# Patient Record
Sex: Female | Born: 1970 | Race: Black or African American | Hispanic: No | Smoking: Never smoker
Health system: Southern US, Community
[De-identification: ages and names within clinical notes are randomized; demographics above are authoritative.]

## PROBLEM LIST (undated history)

## (undated) DIAGNOSIS — C8590 Non-Hodgkin lymphoma, unspecified, unspecified site: Secondary | ICD-10-CM

## (undated) DIAGNOSIS — R011 Cardiac murmur, unspecified: Secondary | ICD-10-CM

## (undated) HISTORY — PX: OTHER SURGICAL HISTORY: SHX169

## (undated) HISTORY — PX: PORTACATH PLACEMENT: SHX2246

## (undated) HISTORY — PX: TUBAL LIGATION: SHX77

---

## 2012-08-31 ENCOUNTER — Emergency Department (HOSPITAL_COMMUNITY)
Admission: EM | Admit: 2012-08-31 | Discharge: 2012-08-31 | Disposition: A | Discharge status: Home / Self Care | Payer: Medicaid - Out of State | Attending: Emergency Medicine | Admitting: Emergency Medicine

## 2012-08-31 ENCOUNTER — Encounter (HOSPITAL_COMMUNITY): Payer: Self-pay | Admitting: *Deleted

## 2012-08-31 ENCOUNTER — Emergency Department (HOSPITAL_COMMUNITY): Payer: Medicaid - Out of State

## 2012-08-31 DIAGNOSIS — X58XXXA Exposure to other specified factors, initial encounter: Secondary | ICD-10-CM | POA: Insufficient documentation

## 2012-08-31 DIAGNOSIS — Y9389 Activity, other specified: Secondary | ICD-10-CM | POA: Insufficient documentation

## 2012-08-31 DIAGNOSIS — Z79899 Other long term (current) drug therapy: Secondary | ICD-10-CM | POA: Insufficient documentation

## 2012-08-31 DIAGNOSIS — S93402A Sprain of unspecified ligament of left ankle, initial encounter: Secondary | ICD-10-CM

## 2012-08-31 DIAGNOSIS — R011 Cardiac murmur, unspecified: Secondary | ICD-10-CM | POA: Insufficient documentation

## 2012-08-31 DIAGNOSIS — S93409A Sprain of unspecified ligament of unspecified ankle, initial encounter: Secondary | ICD-10-CM | POA: Insufficient documentation

## 2012-08-31 DIAGNOSIS — Y929 Unspecified place or not applicable: Secondary | ICD-10-CM | POA: Insufficient documentation

## 2012-08-31 DIAGNOSIS — E119 Type 2 diabetes mellitus without complications: Secondary | ICD-10-CM | POA: Insufficient documentation

## 2012-08-31 HISTORY — DX: Cardiac murmur, unspecified: R01.1

## 2012-08-31 MED ORDER — MELOXICAM 7.5 MG PO TABS: ORAL_TABLET | ORAL | Status: DC

## 2012-08-31 MED ORDER — HYDROCODONE-ACETAMINOPHEN 5-325 MG PO TABS: ORAL_TABLET | ORAL | Status: DC

## 2012-08-31 NOTE — ED Provider Notes (Signed)
Medical screening examination/treatment/procedure(s) were performed by non-physician practitioner and as supervising physician I was immediately available for consultation/collaboration.   Laray Anger, DO 08/31/12 Margretta Ditty

## 2012-08-31 NOTE — ED Notes (Signed)
Lt foot pain for 3 days, no injury,  Alert, Nad.

## 2012-08-31 NOTE — ED Notes (Signed)
H. Bryant, PA at bedside. 

## 2012-08-31 NOTE — ED Provider Notes (Signed)
History     CSN: [id number]  Arrival date & time 08/31/12  1112   First MD Initiated Contact with Patient 08/31/12 1131      Chief Complaint  Patient presents with  . Foot Pain    (Consider location/radiation/quality/duration/timing/severity/associated sxs/prior treatment) Patient is a 42 y.o. female presenting with lower extremity pain. The history is provided by the patient.  Foot Pain This is a recurrent problem. The current episode started in the past 7 days. The problem occurs constantly. The problem has been gradually worsening. Associated symptoms include joint swelling. Pertinent negatives include no abdominal pain, arthralgias, chest pain, coughing, neck pain or numbness. The symptoms are aggravated by standing and walking. She has tried NSAIDs for the symptoms. The treatment provided no relief.    Past Medical History  Diagnosis Date  . Murmur   . Diabetes mellitus without complication     Past Surgical History  Procedure Laterality Date  . Tubal ligation      History reviewed. No pertinent family history.  History  Substance Use Topics  . Smoking status: Never Smoker   . Smokeless tobacco: Not on file  . Alcohol Use: No    OB History   Grav Para Term Preterm Abortions TAB SAB Ect Mult Living                  Review of Systems  Constitutional: Negative for activity change.       All ROS Neg except as noted in HPI  HENT: Negative for nosebleeds and neck pain.   Eyes: Negative for photophobia and discharge.  Respiratory: Negative for cough, shortness of breath and wheezing.   Cardiovascular: Negative for chest pain and palpitations.  Gastrointestinal: Negative for abdominal pain and blood in stool.  Genitourinary: Negative for dysuria, frequency and hematuria.  Musculoskeletal: Positive for joint swelling. Negative for back pain and arthralgias.  Skin: Negative.   Neurological: Negative for dizziness, seizures, speech difficulty and numbness.   Psychiatric/Behavioral: Negative for hallucinations and confusion.    Allergies  Penicillins  Home Medications   Current Outpatient Rx  Name  Route  Sig  Dispense  Refill  . ferrous sulfate 325 (65 FE) MG tablet   Oral   Take 325 mg by mouth daily with breakfast.         . hydrochlorothiazide (HYDRODIURIL) 25 MG tablet   Oral   Take 25 mg by mouth daily.         Marland Kitchen lisinopril (PRINIVIL,ZESTRIL) 20 MG tablet   Oral   Take 20 mg by mouth daily.         . metFORMIN (GLUCOPHAGE) 500 MG tablet   Oral   Take 500 mg by mouth 2 (two) times daily with a meal.         . HYDROcodone-acetaminophen (NORCO/VICODIN) 5-325 MG per tablet      1 or 2 po q4h prn pain   20 tablet   0   . meloxicam (MOBIC) 7.5 MG tablet      1 po bid with food   12 tablet   0     BP 138/82  Pulse 93  Temp(Src) 98.1 F (36.7 C) (Oral)  Resp 20  Ht 5\' 3"  (1.6 m)  Wt 205 lb (92.987 kg)  BMI 36.32 kg/m2  SpO2 100%  Physical Exam  Nursing note and vitals reviewed. Constitutional: She is oriented to person, place, and time. She appears well-developed and well-nourished.  Non-toxic appearance.  HENT:  Head: Normocephalic.  Right Ear: Tympanic membrane and external ear normal.  Left Ear: Tympanic membrane and external ear normal.  Eyes: EOM and lids are normal. Pupils are equal, round, and reactive to light.  Neck: Normal range of motion. Neck supple. Carotid bruit is not present.  Cardiovascular: Normal rate, regular rhythm, normal heart sounds, intact distal pulses and normal pulses.   Pulmonary/Chest: Breath sounds normal. No respiratory distress.  Abdominal: Soft. Bowel sounds are normal. There is no tenderness. There is no guarding.  Musculoskeletal: Normal range of motion.  There is pain of the left lateral malleolus. There is mild swelling present. Full range of motion of the toes. The Achilles tendon is intact. Dorsalis pedis pulses 2+. Full range of motion of the left knee and left  hip  Lymphadenopathy:       Head (right side): No submandibular adenopathy present.       Head (left side): No submandibular adenopathy present.    She has no cervical adenopathy.  Neurological: She is alert and oriented to person, place, and time. She has normal strength. No cranial nerve deficit or sensory deficit.  Skin: Skin is warm and dry.  Psychiatric: She has a normal mood and affect. Her speech is normal.    ED Course  Procedures (including critical care time)  Labs Reviewed - No data to display Dg Foot Complete Left  08/31/2012  *RADIOLOGY REPORT*  Clinical Data: Lateral foot/heel pain  LEFT FOOT - COMPLETE 3+ VIEW  Comparison: None.  Findings: No fracture or dislocation is seen.  The joint spaces are preserved.  Small plantar and posterior calcaneal enthesophytes.  Visualized soft tissues are grossly unremarkable.  IMPRESSION: No acute osseous abnormality is seen.   Original Report Authenticated By: Charline Bills, M.D.      1. Ankle sprain, left, initial encounter       MDM  I have reviewed nursing notes, vital signs, and all appropriate lab and imaging results for this patient. Patient states she does a lot of standing and walking at her job. She is not sure of turning or twisting her ankle and in the one specific time, but states that from time to time she makes" missed steps". The x-ray of the left ankle is negative for fracture or dislocation. There is noted a calcaneal spur present.  The plan at this time is for the patient be treated with very short course of Mobic 7.5 mg, prescription for Norco one or 2 every 4 hours #20 tablets also given to the patient. Patient was fitted with an ankle stirrup splint she will use for the next 10 days. Patient is to return if any changes or complications.      Kathie Dike, [place] 08/31/12 1242

## 2012-08-31 NOTE — ED Notes (Signed)
Patient transported to X-ray 

## 2013-09-24 ENCOUNTER — Encounter (HOSPITAL_COMMUNITY): Payer: Self-pay | Admitting: Emergency Medicine

## 2013-09-24 ENCOUNTER — Emergency Department (HOSPITAL_COMMUNITY)
Admission: EM | Admit: 2013-09-24 | Discharge: 2013-09-24 | Disposition: A | Discharge status: Home / Self Care | Payer: Medicaid - Out of State | Attending: Emergency Medicine | Admitting: Emergency Medicine

## 2013-09-24 DIAGNOSIS — M62838 Other muscle spasm: Secondary | ICD-10-CM | POA: Insufficient documentation

## 2013-09-24 DIAGNOSIS — Z880 Allergy status to penicillin: Secondary | ICD-10-CM | POA: Insufficient documentation

## 2013-09-24 DIAGNOSIS — M5412 Radiculopathy, cervical region: Secondary | ICD-10-CM | POA: Insufficient documentation

## 2013-09-24 DIAGNOSIS — R011 Cardiac murmur, unspecified: Secondary | ICD-10-CM | POA: Insufficient documentation

## 2013-09-24 DIAGNOSIS — E119 Type 2 diabetes mellitus without complications: Secondary | ICD-10-CM | POA: Insufficient documentation

## 2013-09-24 DIAGNOSIS — Z79899 Other long term (current) drug therapy: Secondary | ICD-10-CM | POA: Insufficient documentation

## 2013-09-24 MED ORDER — CYCLOBENZAPRINE HCL 5 MG PO TABS: 5.0000 mg | ORAL_TABLET | Freq: Three times a day (TID) | ORAL | Status: DC | PRN

## 2013-09-24 MED ORDER — TRAMADOL HCL 50 MG PO TABS: 50.0000 mg | ORAL_TABLET | Freq: Four times a day (QID) | ORAL | Status: DC | PRN

## 2013-09-24 NOTE — Discharge Instructions (Signed)
Cervical Radiculopathy Cervical radiculopathy happens when a nerve in the neck is pinched or bruised by a slipped (herniated) disk or by arthritic changes in the bones of the cervical spine. This can occur due to an injury or as part of the normal aging process. Pressure on the cervical nerves can cause pain or numbness that runs from your neck all the way down into your arm and fingers. CAUSES  There are many possible causes, including:  Injury.  Muscle tightness in the neck from overuse.  Swollen, painful joints (arthritis).  Breakdown or degeneration in the bones and joints of the spine (spondylosis) due to aging.  Bone spurs that may develop near the cervical nerves. SYMPTOMS  Symptoms include pain, weakness, or numbness in the affected arm and hand. Pain can be severe or irritating. Symptoms may be worse when extending or turning the neck. DIAGNOSIS  Your caregiver will ask about your symptoms and do a physical exam. He or she may test your strength and reflexes. X-rays, CT scans, and MRI scans may be needed in cases of injury or if the symptoms do not go away after a period of time. Electromyography (EMG) or nerve conduction testing may be done to study how your nerves and muscles are working. TREATMENT  Your caregiver may recommend certain exercises to help relieve your symptoms. Cervical radiculopathy can, and often does, get better with time and treatment. If your problems continue, treatment options may include:  Wearing a soft collar for short periods of time.  Physical therapy to strengthen the neck muscles.  Medicines, such as nonsteroidal anti-inflammatory drugs (NSAIDs), oral corticosteroids, or spinal injections.  Surgery. Different types of surgery may be done depending on the cause of your problems. HOME CARE INSTRUCTIONS   Put ice on the affected area.  Put ice in a plastic bag.  Place a towel between your skin and the bag.  Leave the ice on for 15-20 minutes,  03-04 times a day or as directed by your caregiver.  If ice does not help, you can try using heat. Take a warm shower or bath, or use a hot water bottle as directed by your caregiver.  You may try a gentle neck and shoulder massage.  Use a flat pillow when you sleep.  Only take over-the-counter or prescription medicines for pain, discomfort, or fever as directed by your caregiver.  If physical therapy was prescribed, follow your caregiver's directions.  If a soft collar was prescribed, use it as directed. SEEK IMMEDIATE MEDICAL CARE IF:   Your pain gets much worse and cannot be controlled with medicines.  You have weakness or numbness in your hand, arm, face, or leg.  You have a high fever or a stiff, rigid neck.  You lose bowel or bladder control (incontinence).  You have trouble with walking, balance, or speaking. MAKE SURE YOU:   Understand these instructions.  Will watch your condition.  Will get help right away if you are not doing well or get worse. Document Released: 03/19/2001 Document Revised: 09/16/2011 Document Reviewed: 02/05/2011 [facility] Patient Information 2014 [place], [place].   You may continue taking your ibuprofen,  But not more than 600 mg (three 200 mg tablets) every 6 hours.  You may take the narcotic medicine prescribed you today.  This can make you sleepy - do not drive within 4 hours of taking this medicine.  Apply a heating pad to your neck and shoulder area for 20 minutes 3-4 times daily.  Call Dr Aline Brochure for  further evaluation of your symptoms if they persist.

## 2013-09-24 NOTE — ED Notes (Signed)
Complain of pain in left shoulder down arm. States it has been there for a month. States she was evaluated at another hospital for same

## 2013-09-27 NOTE — ED Provider Notes (Signed)
CSN: [id number]     Arrival date & time 09/24/13  1230 History   First MD Initiated Contact with Patient 09/24/13 1317     Chief Complaint  Patient presents with  . Shoulder Pain     (Consider location/radiation/quality/duration/timing/severity/associated sxs/prior Treatment) HPI Comments: Pt with a one month history of left shoulder pain radiating into her left arm.  She denies injury and was seen by another ed several weeks ago at which time her shoulder and neck xrays were normal, per patient report. She was diagnosed with cervical radiculopathy.  Pt is desirous of another opinion.  She was referred to a specialist but has not contacted this provider for further management.  Patient is a 43 y.o. female presenting with shoulder pain. The history is provided by the patient.  Shoulder Pain This is a new problem. Episode onset: 1 month. The problem occurs constantly. The problem has been unchanged. Associated symptoms include arthralgias. Pertinent negatives include no chest pain, fatigue, fever, headaches, joint swelling, myalgias, nausea, neck pain, numbness, rash, sore throat, swollen glands or weakness. Associated symptoms comments: Positive for radiculopathy down her left arm to her wrist.  She denies sob.. Exacerbated by: movement. She has tried acetaminophen and NSAIDs for the symptoms. The treatment provided no relief.    Past Medical History  Diagnosis Date  . Murmur   . Diabetes mellitus without complication    Past Surgical History  Procedure Laterality Date  . Tubal ligation     No family history on file. History  Substance Use Topics  . Smoking status: Never Smoker   . Smokeless tobacco: Not on file  . Alcohol Use: No   OB History   Grav Para Term Preterm Abortions TAB SAB Ect Mult Living                 Review of Systems  Constitutional: Negative for fever and fatigue.  HENT: Negative for sore throat.   Respiratory: Negative for chest tightness and shortness of  breath.   Cardiovascular: Negative for chest pain.  Gastrointestinal: Negative for nausea.  Musculoskeletal: Positive for arthralgias. Negative for joint swelling, myalgias, neck pain and neck stiffness.  Skin: Negative for rash.  Neurological: Negative for weakness, numbness and headaches.      Allergies  Penicillins  Home Medications   Current Outpatient Rx  Name  Route  Sig  Dispense  Refill  . ferrous sulfate 325 (65 FE) MG tablet   Oral   Take 325 mg by mouth daily with breakfast.         . hydrochlorothiazide (HYDRODIURIL) 25 MG tablet   Oral   Take 25 mg by mouth daily.         Marland Kitchen lisinopril (PRINIVIL,ZESTRIL) 20 MG tablet   Oral   Take 20 mg by mouth daily.         Marland Kitchen OVER THE COUNTER MEDICATION   Oral   Take 500 mg by mouth every 6 (six) hours as needed (pain).         . cyclobenzaprine (FLEXERIL) 5 MG tablet   Oral   Take 1 tablet (5 mg total) by mouth 3 (three) times daily as needed for muscle spasms.   15 tablet   0   . traMADol (ULTRAM) 50 MG tablet   Oral   Take 1 tablet (50 mg total) by mouth every 6 (six) hours as needed.   15 tablet   0    BP 149/82  Pulse 90  Temp(Src) 97.9  F (36.6 C)  Resp 20  Ht 5\' 3"  (1.6 m)  Wt 211 lb (95.709 kg)  BMI 37.39 kg/m2  SpO2 100%  LMP 09/20/2013 Physical Exam  Constitutional: She appears well-developed and well-nourished.  HENT:  Head: Atraumatic.  Neck: Normal range of motion and full passive range of motion without pain. Muscular tenderness present. No spinous process tenderness present. Normal range of motion present.  Cardiovascular:  Pulses equal bilaterally  Musculoskeletal: She exhibits tenderness.       Left shoulder: She exhibits bony tenderness and spasm. She exhibits no swelling, no effusion, no crepitus and no deformity.  ttp at left cervical soft tissue including trapezius muscle.  No pain with palpation of shoulder, elbow and wrist.  Radial pulses full and equal,  Distal sensation  intact.    Neurological: She is alert. She has normal strength. She displays normal reflexes. No cranial nerve deficit or sensory deficit.  Equal grip strength.  Skin: Skin is warm and dry.  Psychiatric: She has a normal mood and affect.    ED Course  Procedures (including critical care time) Labs Review Labs Reviewed - No data to display Imaging Review No results found.   EKG Interpretation None      MDM   Final diagnoses:  Cervical radiculopathy    Exam c/w cervical radiculopathy.  She was prescribed tramadol, flexeril.  Encouraged heat tx to neck and shoulder.  Referral to ortho for further management if sx not improved with tx.  The patient appears reasonably screened and/or stabilized for discharge and I doubt any other medical condition or other [facility] requiring further screening, evaluation, or treatment in the ED at this time prior to discharge.     Evalee Jefferson, PA-C 09/27/13 1419

## 2013-09-29 NOTE — ED Provider Notes (Signed)
Medical screening examination/treatment/procedure(s) were performed by non-physician practitioner and as supervising physician I was immediately available for consultation/collaboration.   EKG Interpretation None          Christopher J. Pollina, MD 09/29/13 0703 

## 2013-10-01 ENCOUNTER — Emergency Department (HOSPITAL_COMMUNITY): Payer: Medicaid - Out of State

## 2013-10-01 ENCOUNTER — Emergency Department (HOSPITAL_COMMUNITY)
Admission: EM | Admit: 2013-10-01 | Discharge: 2013-10-01 | Disposition: A | Discharge status: Home / Self Care | Payer: Medicaid - Out of State | Attending: Emergency Medicine | Admitting: Emergency Medicine

## 2013-10-01 ENCOUNTER — Encounter (HOSPITAL_COMMUNITY): Payer: Self-pay | Admitting: Emergency Medicine

## 2013-10-01 DIAGNOSIS — E119 Type 2 diabetes mellitus without complications: Secondary | ICD-10-CM | POA: Insufficient documentation

## 2013-10-01 DIAGNOSIS — Z79899 Other long term (current) drug therapy: Secondary | ICD-10-CM | POA: Insufficient documentation

## 2013-10-01 DIAGNOSIS — Z880 Allergy status to penicillin: Secondary | ICD-10-CM | POA: Insufficient documentation

## 2013-10-01 DIAGNOSIS — R011 Cardiac murmur, unspecified: Secondary | ICD-10-CM | POA: Insufficient documentation

## 2013-10-01 DIAGNOSIS — M25519 Pain in unspecified shoulder: Secondary | ICD-10-CM | POA: Insufficient documentation

## 2013-10-01 DIAGNOSIS — M25512 Pain in left shoulder: Secondary | ICD-10-CM

## 2013-10-01 MED ORDER — OXYCODONE-ACETAMINOPHEN 5-325 MG PO TABS: 1.0000 | ORAL_TABLET | ORAL | Status: DC | PRN

## 2013-10-01 MED ORDER — PREDNISONE 50 MG PO TABS
60.0000 mg | ORAL_TABLET | Freq: Once | ORAL | Status: AC
  Administered 2013-10-01: 60 mg via ORAL
  Filled 2013-10-01 (×2): qty 1

## 2013-10-01 MED ORDER — INDOMETHACIN 50 MG PO CAPS: 50.0000 mg | ORAL_CAPSULE | Freq: Three times a day (TID) | ORAL | Status: DC

## 2013-10-01 MED ORDER — PREDNISONE 50 MG PO TABS: 50.0000 mg | ORAL_TABLET | Freq: Every day | ORAL | Status: DC

## 2013-10-01 NOTE — ED Notes (Signed)
Pt states she has been having trouble w/ left shoulder for a month. Pain going under left breast. Pt states hurts w/ movement & taking a deep breath. Good pulses, cap refill & sensation.

## 2013-10-01 NOTE — ED Provider Notes (Signed)
CSN: [id number]     Arrival date & time 10/01/13  0444 History   First MD Initiated Contact with Patient 10/01/13 0459     Chief Complaint  Patient presents with  . Shoulder Pain     (Consider location/radiation/quality/duration/timing/severity/associated sxs/prior Treatment) Patient is a 43 y.o. female presenting with shoulder pain. The history is provided by the patient.  Shoulder Pain   43 year old female has been having pain in the left shoulder for the last month and it is getting worse. Pain radiates to the anterior chest and down her arm. It is worse with any movement. Pain is dull and throbbing and severe and she rates at 10/10. Nothing makes it better. She was seen in the ED one week ago and treated with tramadol and cyclobenzaprine with no relief. Today, she took [facility] powder, ibuprofen, tramadol, and cyclobenzaprine with no relief. She denies any initial trauma or repetitive motion. She has not had shoulder problems before. . Past Medical History  Diagnosis Date  . Murmur   . Diabetes mellitus without complication    Past Surgical History  Procedure Laterality Date  . Tubal ligation     No family history on file. History  Substance Use Topics  . Smoking status: Never Smoker   . Smokeless tobacco: Not on file  . Alcohol Use: No   OB History   Grav Para Term Preterm Abortions TAB SAB Ect Mult Living                 Review of Systems  All other systems reviewed and are negative.      Allergies  Penicillins  Home Medications   Current Outpatient Rx  Name  Route  Sig  Dispense  Refill  . cyclobenzaprine (FLEXERIL) 5 MG tablet   Oral   Take 1 tablet (5 mg total) by mouth 3 (three) times daily as needed for muscle spasms.   15 tablet   0   . ferrous sulfate 325 (65 FE) MG tablet   Oral   Take 325 mg by mouth daily with breakfast.         . hydrochlorothiazide (HYDRODIURIL) 25 MG tablet   Oral   Take 25 mg by mouth daily.         Marland Kitchen lisinopril  (PRINIVIL,ZESTRIL) 20 MG tablet   Oral   Take 20 mg by mouth daily.         Marland Kitchen OVER THE COUNTER MEDICATION   Oral   Take 500 mg by mouth every 6 (six) hours as needed (pain).         . traMADol (ULTRAM) 50 MG tablet   Oral   Take 1 tablet (50 mg total) by mouth every 6 (six) hours as needed.   15 tablet   0    BP 168/104  Pulse 101  Temp(Src) 98 F (36.7 C) (Oral)  Resp 20  Ht 5\' 2"  (1.575 m)  Wt 200 lb (90.719 kg)  BMI 36.57 kg/m2  SpO2 100%  LMP 09/20/2013 Physical Exam  Nursing note and vitals reviewed.  43 year old female, who appears uncomfortable, but is in no acute distress. Vital signs are significant for hypertension with blood pressure 160/104, and borderline tachycardia with heart rate 101. Oxygen saturation is 100%, which is normal. Head is normocephalic and atraumatic. PERRLA, EOMI. Oropharynx is clear. Neck is nontender and supple without adenopathy or JVD. Back is nontender and there is no CVA tenderness. Lungs are clear without rales, wheezes, or rhonchi. Chest  has mild tenderness in the left anterior chest wall. Heart has regular rate and rhythm without murmur. Abdomen is soft, flat, nontender without masses or hepatosplenomegaly and peristalsis is normoactive. Extremities: There is mild soft tissue swelling around the left shoulder with tenderness rather diffusely. There is slight warmth to this area compared with the contralateral side. Range of motion is the left shoulder is restricted by pain and there is pain with virtually any passive movement. Distal neurovascular exam is intact with strong pulses, prompt capillary refill, normal sensation and motor function. Skin is warm and dry without rash. Neurologic: Mental status is normal, cranial nerves are intact, there are no motor or sensory deficits.  ED Course  Procedures (including critical care time) Imaging Review Dg Shoulder Left  10/01/2013   CLINICAL DATA:  Left-sided shoulder pain for the past  month.  EXAM: LEFT SHOULDER - 2+ VIEW  COMPARISON:  No priors.  FINDINGS: Multiple views of the left shoulder demonstrate no acute displaced fracture, subluxation, dislocation, or soft tissue abnormality.  IMPRESSION: No acute radiographic abnormality of the left shoulder.   Electronically Signed   By: Vinnie Langton M.D.   On: 10/01/2013 05:49     EKG Interpretation   Date/Time:  Friday October 01 2013 04:57:36 EDT Ventricular Rate:  89 PR Interval:  162 QRS Duration: 84 QT Interval:  372 QTC Calculation: 452 R Axis:   46 Text Interpretation:  Normal sinus rhythm Normal ECG No previous ECGs  available Confirmed by [facility]  MD, Corryn Madewell ([id number]) on 10/01/2013 5:11:51 AM      MDM   Final diagnoses:  Pain in left shoulder    Left shoulder pain of uncertain cause. Consider rotator cuff injury, and consider a frozen shoulder, consider [place] shoulder. Shoulder x-ray will be obtained she will be given a therapeutic trial of prednisone and placed on indomethacin and oxycodone-acetaminophen and referred to orthopedics for followup.  Extremities unremarkable. She is discharged with the above-noted plan.  Delora Fuel, MD 88/50/27 [id number]

## 2013-10-01 NOTE — ED Notes (Signed)
Pt alert & oriented x4, stable gait. Patient given discharge instructions, paperwork & prescription(s). Patient  instructed to stop at the registration desk to finish any additional paperwork. Patient verbalized understanding. Pt left department w/ no further questions. 

## 2013-10-01 NOTE — Discharge Instructions (Signed)
The cause for your shoulder pain is not clear. There several things that could cause it including rotator cuff injury, frozen shoulder, and various types of arthritis. Please make a followup appointment with one of the orthopedic physicians, or ask your PCP for referral to a local orthopedic physician. Wear the sling as needed.  Shoulder Pain The shoulder is the joint that connects your arms to your body. The bones that form the shoulder joint include the upper arm bone (humerus), the shoulder blade (scapula), and the collarbone (clavicle). The top of the humerus is shaped like a ball and fits into a rather flat socket on the scapula (glenoid cavity). A combination of muscles and strong, fibrous tissues that connect muscles to bones (tendons) support your shoulder joint and hold the ball in the socket. Small, fluid-filled sacs (bursae) are located in different areas of the joint. They act as cushions between the bones and the overlying soft tissues and help reduce friction between the gliding tendons and the bone as you move your arm. Your shoulder joint allows a wide range of motion in your arm. This range of motion allows you to do things like scratch your back or throw a ball. However, this range of motion also makes your shoulder more prone to pain from overuse and injury. Causes of shoulder pain can originate from both injury and overuse and usually can be grouped in the following four categories:  Redness, swelling, and pain (inflammation) of the tendon (tendinitis) or the bursae (bursitis).  Instability, such as a dislocation of the joint.  Inflammation of the joint (arthritis).  Broken bone (fracture). HOME CARE INSTRUCTIONS   Apply ice to the sore area.  Put ice in a plastic bag.  Place a towel between your skin and the bag.  Leave the ice on for 15-20 minutes, 03-04 times per day for the first 2 days.  Stop using cold packs if they do not help with the pain.  If you have a shoulder  sling or immobilizer, wear it as long as your caregiver instructs. Only remove it to shower or bathe. Move your arm as little as possible, but keep your hand moving to prevent swelling.  Squeeze a soft ball or foam pad as much as possible to help prevent swelling.  Only take over-the-counter or prescription medicines for pain, discomfort, or fever as directed by your caregiver. SEEK MEDICAL CARE IF:   Your shoulder pain increases, or new pain develops in your arm, hand, or fingers.  Your hand or fingers become cold and numb.  Your pain is not relieved with medicines. SEEK IMMEDIATE MEDICAL CARE IF:   Your arm, hand, or fingers are numb or tingling.  Your arm, hand, or fingers are significantly swollen or turn white or blue. MAKE SURE YOU:   Understand these instructions.  Will watch your condition.  Will get help right away if you are not doing well or get worse. Document Released: 04/03/2005 Document Revised: 03/18/2012 Document Reviewed: 06/08/2011 [facility] Patient Information 2014 Spring.  Indomethacin capsules What is this medicine? INDOMETHACIN (in doe METH a sin) is a non-steroidal anti-inflammatory drug (NSAID). It is used to reduce swelling and to treat pain. It may be used for painful joint and muscular problems such as arthritis, tendinitis, bursitis, and gout. This medicine may be used for other purposes; ask your health care provider or pharmacist if you have questions. COMMON BRAND NAME(S): Indocin What should I tell my health care provider before I take this medicine?  They need to know if you have any of these conditions: -asthma, especially aspirin sensitive asthma -coronary artery bypass graft (CABG) surgery within the past 2 weeks -depression -drink more than 3 alcohol containing drinks a day -heart disease or circulation problems like heart failure or leg edema (fluid retention) -high blood pressure -kidney disease -liver disease -Parkinson's  disease -seizures -stomach bleeding or ulcers -an unusual or allergic reaction to indomethacin, aspirin, other NSAIDs, other medicines, foods, dyes, or preservatives -pregnant or trying to get pregnant -breast-feeding How should I use this medicine? Take this medicine by mouth with food and with a full glass of water. Follow the directions on the prescription label. Take your medicine at regular intervals. Do not take your medicine more often than directed. Long-term, continuous use may increase the risk of heart attack or stroke. A special MedGuide will be given to you by the pharmacist with each prescription and refill. Be sure to read this information carefully each time. Talk to your pediatrician regarding the use of this medicine in children. Special care may be needed. While this drug may be prescribed for children as young as 15 years for selected conditions, precautions do apply. Elderly patients over 57 years old may have a stronger reaction and need a smaller dose. Overdosage: If you think you have taken too much of this medicine contact a poison control center or emergency room at once. NOTE: This medicine is only for you. Do not share this medicine with others. What if I miss a dose? If you miss a dose, take it as soon as you can. If it is almost time for your next dose, take only that dose. Do not take double or extra doses. What may interact with this medicine? Do not take this medicine with any of the following medications: -cidofovir -diflunisal -ketorolac -methotrexate -pemetrexed -triamterene This medicine may also interact with the following medications: -alcohol -antacids -aspirin and aspirin-like medicines -cyclosporine -digoxin -diuretics -lithium -medicines for diabetes -medicines for high blood pressure -medicines that affect platelets -medicines that treat or prevent blood clots like warfarin -NSAIDs, medicines for pain and inflammation, like ibuprofen or  naproxen -probenecid -steroid medicines like prednisone or cortisone This list may not describe all possible interactions. Give your health care provider a list of all the medicines, herbs, non-prescription drugs, or dietary supplements you use. Also tell them if you smoke, drink alcohol, or use illegal drugs. Some items may interact with your medicine. What should I watch for while using this medicine? Tell your doctor or health care professional if your pain does not get better. Talk to your doctor before taking another medicine for pain. Do not treat yourself. This medicine does not prevent heart attack or stroke. In fact, this medicine may increase the chance of a heart attack or stroke. The chance may increase with longer use of this medicine and in people who have heart disease. If you take aspirin to prevent heart attack or stroke, talk with your doctor or health care professional. Do not take medicines such as ibuprofen and naproxen with this medicine. Side effects such as stomach upset, nausea, or ulcers may be more likely to occur. Many medicines available without a prescription should not be taken with this medicine. This medicine can cause ulcers and bleeding in the stomach and intestines at any time during treatment. Do not smoke cigarettes or drink alcohol. These increase irritation to your stomach and can make it more susceptible to damage from this medicine. Ulcers and bleeding can  happen without warning symptoms and can cause death. You may get drowsy or dizzy. Do not drive, use machinery, or do anything that needs mental alertness until you know how this medicine affects you. Do not stand or sit up quickly, especially if you are an older patient. This reduces the risk of dizzy or fainting spells. This medicine can cause you to bleed more easily. Try to avoid damage to your teeth and gums when you brush or floss your teeth. What side effects may I notice from receiving this  medicine? Side effects that you should report to your doctor or health care professional as soon as possible: -allergic reactions like skin rash, itching or hives, swelling of the face, lips, or tongue -difficulty breathing or wheezing -nausea, vomiting -signs and symptoms of bleeding such as bloody or black, tarry stools; red or dark-brown urine; spitting up blood or brown material that looks like coffee grounds; red spots on the skin; unusual bruising or bleeding from the eye, gums, or nose -signs and symptoms of a blood clot such as changes in vision; chest pain; severe, sudden headache; trouble speaking; sudden numbness or weakness of the face, arm, or leg; trouble walking -unexplained weight gain or swelling -unusually weak or tired -yellowing of eyes or skin  Side effects that usually do not require medical attention (report to your doctor or health care professional if they continue or are bothersome): -diarrhea -dizziness -headache -heartburn This list may not describe all possible side effects. Call your doctor for medical advice about side effects. You may report side effects to FDA at 1-800-FDA-1088. Where should I keep my medicine? Keep out of the reach of children. Store at room temperature between 15 and 30 degrees C (59 and 86 degrees F). Keep container tightly closed. Throw away any unused medicine after the expiration date. NOTE: This sheet is a summary. It may not cover all possible information. If you have questions about this medicine, talk to your doctor, pharmacist, or health care provider.  2014, [employer] Standard. (2012-11-10 15:28:44)  Prednisone tablets What is this medicine? PREDNISONE (PRED ni sone) is a corticosteroid. It is commonly used to treat inflammation of the skin, joints, lungs, and other organs. Common conditions treated include asthma, allergies, and arthritis. It is also used for other conditions, such as blood disorders and diseases of the  adrenal glands. This medicine may be used for other purposes; ask your health care provider or pharmacist if you have questions. COMMON BRAND NAME(S): Deltasone, Predone, Sterapred DS, Sterapred What should I tell my health care provider before I take this medicine? They need to know if you have any of these conditions: -Cushing's syndrome -diabetes -glaucoma -heart disease -high blood pressure -infection (especially a virus infection such as chickenpox, cold sores, or herpes) -kidney disease -liver disease -mental illness -myasthenia gravis -osteoporosis -seizures -stomach or intestine problems -thyroid disease -an unusual or allergic reaction to lactose, prednisone, other medicines, foods, dyes, or preservatives -pregnant or trying to get pregnant -breast-feeding How should I use this medicine? Take this medicine by mouth with a glass of water. Follow the directions on the prescription label. Take this medicine with food. If you are taking this medicine once a day, take it in the morning. Do not take more medicine than you are told to take. Do not suddenly stop taking your medicine because you may develop a severe reaction. Your doctor will tell you how much medicine to take. If your doctor wants you to stop the medicine, the  dose may be slowly lowered over time to avoid any side effects. Talk to your pediatrician regarding the use of this medicine in children. Special care may be needed. Overdosage: If you think you have taken too much of this medicine contact a poison control center or emergency room at once. NOTE: This medicine is only for you. Do not share this medicine with others. What if I miss a dose? If you miss a dose, take it as soon as you can. If it is almost time for your next dose, talk to your doctor or health care professional. You may need to miss a dose or take an extra dose. Do not take double or extra doses without advice. What may interact with this medicine? Do  not take this medicine with any of the following medications: -metyrapone -mifepristone This medicine may also interact with the following medications: -aminoglutethimide -amphotericin B -aspirin and aspirin-like medicines -barbiturates -certain medicines for diabetes, like glipizide or glyburide -cholestyramine -cholinesterase inhibitors -cyclosporine -digoxin -diuretics -ephedrine -female hormones, like estrogens and birth control pills -isoniazid -ketoconazole -NSAIDS, medicines for pain and inflammation, like ibuprofen or naproxen -phenytoin -rifampin -toxoids -vaccines -warfarin This list may not describe all possible interactions. Give your health care provider a list of all the medicines, herbs, non-prescription drugs, or dietary supplements you use. Also tell them if you smoke, drink alcohol, or use illegal drugs. Some items may interact with your medicine. What should I watch for while using this medicine? Visit your doctor or health care professional for regular checks on your progress. If you are taking this medicine over a prolonged period, carry an identification card with your name and address, the type and dose of your medicine, and your doctor's name and address. This medicine may increase your risk of getting an infection. Tell your doctor or health care professional if you are around anyone with measles or chickenpox, or if you develop sores or blisters that do not heal properly. If you are going to have surgery, tell your doctor or health care professional that you have taken this medicine within the last twelve months. Ask your doctor or health care professional about your diet. You may need to lower the amount of salt you eat. This medicine may affect blood sugar levels. If you have diabetes, check with your doctor or health care professional before you change your diet or the dose of your diabetic medicine. What side effects may I notice from receiving this  medicine? Side effects that you should report to your doctor or health care professional as soon as possible: -allergic reactions like skin rash, itching or hives, swelling of the face, lips, or tongue -changes in emotions or moods -changes in vision -depressed mood -eye pain -fever or chills, cough, sore throat, pain or difficulty passing urine -increased thirst -swelling of ankles, feet Side effects that usually do not require medical attention (report to your doctor or health care professional if they continue or are bothersome): -confusion, excitement, restlessness -headache -nausea, vomiting -skin problems, acne, thin and shiny skin -trouble sleeping -weight gain This list may not describe all possible side effects. Call your doctor for medical advice about side effects. You may report side effects to FDA at 1-800-FDA-1088. Where should I keep my medicine? Keep out of the reach of children. Store at room temperature between 15 and 30 degrees C (59 and 86 degrees F). Protect from light. Keep container tightly closed. Throw away any unused medicine after the expiration date. NOTE: This sheet is  a summary. It may not cover all possible information. If you have questions about this medicine, talk to your doctor, pharmacist, or health care provider.  2014, [employer] Standard. (2011-02-07 10:57:14)  Acetaminophen; Oxycodone tablets What is this medicine? ACETAMINOPHEN; OXYCODONE (a set a MEE noe fen; ox i KOE done) is a pain reliever. It is used to treat mild to moderate pain. This medicine may be used for other purposes; ask your health care provider or pharmacist if you have questions. COMMON BRAND NAME(S): Endocet, Magnacet, Narvox, Percocet, Perloxx, Primalev, Primlev, Roxicet, Xolox What should I tell my health care provider before I take this medicine? They need to know if you have any of these conditions: -brain tumor -Crohn's disease, inflammatory bowel disease, or  ulcerative colitis -drug abuse or addiction -head injury -heart or circulation problems -if you often drink alcohol -kidney disease or problems going to the bathroom -liver disease -lung disease, asthma, or breathing problems -an unusual or allergic reaction to acetaminophen, oxycodone, other opioid analgesics, other medicines, foods, dyes, or preservatives -pregnant or trying to get pregnant -breast-feeding How should I use this medicine? Take this medicine by mouth with a full glass of water. Follow the directions on the prescription label. Take your medicine at regular intervals. Do not take your medicine more often than directed. Talk to your pediatrician regarding the use of this medicine in children. Special care may be needed. Patients over 48 years old may have a stronger reaction and need a smaller dose. Overdosage: If you think you have taken too much of this medicine contact a poison control center or emergency room at once. NOTE: This medicine is only for you. Do not share this medicine with others. What if I miss a dose? If you miss a dose, take it as soon as you can. If it is almost time for your next dose, take only that dose. Do not take double or extra doses. What may interact with this medicine? -alcohol -antihistamines -barbiturates like amobarbital, butalbital, butabarbital, methohexital, pentobarbital, phenobarbital, thiopental, and secobarbital -benztropine -drugs for bladder problems like solifenacin, trospium, oxybutynin, tolterodine, hyoscyamine, and methscopolamine -drugs for breathing problems like ipratropium and tiotropium -drugs for certain stomach or intestine problems like propantheline, homatropine methylbromide, glycopyrrolate, atropine, belladonna, and dicyclomine -general anesthetics like etomidate, ketamine, nitrous oxide, propofol, desflurane, enflurane, halothane, isoflurane, and sevoflurane -medicines for depression, anxiety, or psychotic  disturbances -medicines for sleep -muscle relaxants -naltrexone -narcotic medicines (opiates) for pain -phenothiazines like perphenazine, thioridazine, chlorpromazine, mesoridazine, fluphenazine, prochlorperazine, promazine, and trifluoperazine -scopolamine -tramadol -trihexyphenidyl This list may not describe all possible interactions. Give your health care provider a list of all the medicines, herbs, non-prescription drugs, or dietary supplements you use. Also tell them if you smoke, drink alcohol, or use illegal drugs. Some items may interact with your medicine. What should I watch for while using this medicine? Tell your doctor or health care professional if your pain does not go away, if it gets worse, or if you have new or a different type of pain. You may develop tolerance to the medicine. Tolerance means that you will need a higher dose of the medication for pain relief. Tolerance is normal and is expected if you take this medicine for a long time. Do not suddenly stop taking your medicine because you may develop a severe reaction. Your body becomes used to the medicine. This does NOT mean you are addicted. Addiction is a behavior related to getting and using a drug for a non-medical reason. If you have  pain, you have a medical reason to take pain medicine. Your doctor will tell you how much medicine to take. If your doctor wants you to stop the medicine, the dose will be slowly lowered over time to avoid any side effects. You may get drowsy or dizzy. Do not drive, use machinery, or do anything that needs mental alertness until you know how this medicine affects you. Do not stand or sit up quickly, especially if you are an older patient. This reduces the risk of dizzy or fainting spells. Alcohol may interfere with the effect of this medicine. Avoid alcoholic drinks. There are different types of narcotic medicines (opiates) for pain. If you take more than one type at the same time, you may have  more side effects. Give your health care provider a list of all medicines you use. Your doctor will tell you how much medicine to take. Do not take more medicine than directed. Call emergency for help if you have problems breathing. The medicine will cause constipation. Try to have a bowel movement at least every 2 to 3 days. If you do not have a bowel movement for 3 days, call your doctor or health care professional. Do not take Tylenol (acetaminophen) or medicines that have acetaminophen with this medicine. Too much acetaminophen can be very dangerous. Many nonprescription medicines contain acetaminophen. Always read the labels carefully to avoid taking more acetaminophen. What side effects may I notice from receiving this medicine? Side effects that you should report to your doctor or health care professional as soon as possible: -allergic reactions like skin rash, itching or hives, swelling of the face, lips, or tongue -breathing difficulties, wheezing -confusion -light headedness or fainting spells -severe stomach pain -unusually weak or tired -yellowing of the skin or the whites of the eyes  Side effects that usually do not require medical attention (report to your doctor or health care professional if they continue or are bothersome): -dizziness -drowsiness -nausea -vomiting This list may not describe all possible side effects. Call your doctor for medical advice about side effects. You may report side effects to FDA at 1-800-FDA-1088. Where should I keep my medicine? Keep out of the reach of children. This medicine can be abused. Keep your medicine in a safe place to protect it from theft. Do not share this medicine with anyone. Selling or giving away this medicine is dangerous and against the law. Store at room temperature between 20 and 25 degrees C (68 and 77 degrees F). Keep container tightly closed. Protect from light. This medicine may cause accidental overdose and death if it is  taken by other adults, children, or pets. Flush any unused medicine down the toilet to reduce the chance of harm. Do not use the medicine after the expiration date. NOTE: This sheet is a summary. It may not cover all possible information. If you have questions about this medicine, talk to your doctor, pharmacist, or health care provider.  2014, [employer] Standard. (2013-02-15 13:17:35)

## 2013-10-01 NOTE — ED Notes (Signed)
Pain in left shoulder that has worsened over the past month, is unable to rest.

## 2013-10-05 MED FILL — Oxycodone w/ Acetaminophen Tab 5-325 MG: ORAL | Qty: 6 | Status: AC

## 2013-10-26 ENCOUNTER — Emergency Department (HOSPITAL_COMMUNITY)
Admission: EM | Admit: 2013-10-26 | Discharge: 2013-10-26 | Discharge status: Against Medical Advice | Payer: Medicaid - Out of State | Attending: Emergency Medicine | Admitting: Emergency Medicine

## 2013-10-26 ENCOUNTER — Encounter (HOSPITAL_COMMUNITY): Payer: Self-pay | Admitting: Emergency Medicine

## 2013-10-26 DIAGNOSIS — M549 Dorsalgia, unspecified: Secondary | ICD-10-CM | POA: Insufficient documentation

## 2013-10-26 DIAGNOSIS — E119 Type 2 diabetes mellitus without complications: Secondary | ICD-10-CM | POA: Insufficient documentation

## 2013-10-26 DIAGNOSIS — M25519 Pain in unspecified shoulder: Secondary | ICD-10-CM | POA: Insufficient documentation

## 2013-10-26 DIAGNOSIS — R209 Unspecified disturbances of skin sensation: Secondary | ICD-10-CM | POA: Insufficient documentation

## 2013-10-26 DIAGNOSIS — R011 Cardiac murmur, unspecified: Secondary | ICD-10-CM | POA: Insufficient documentation

## 2013-10-26 DIAGNOSIS — M79609 Pain in unspecified limb: Secondary | ICD-10-CM | POA: Insufficient documentation

## 2013-10-26 NOTE — ED Notes (Signed)
Third attempt to call for room.  No response

## 2013-10-26 NOTE — ED Notes (Signed)
Called for room placement.  No response. 

## 2013-10-26 NOTE — ED Notes (Signed)
No answer when called to treatment room.  

## 2013-10-26 NOTE — ED Notes (Addendum)
Pt reporting pain in left shoulder, collar bone, and moving down arm.  Reports pain with deep breath, and also numbness. Pt has been seen previously for same.

## 2014-05-13 ENCOUNTER — Emergency Department (HOSPITAL_COMMUNITY)
Admission: EM | Admit: 2014-05-13 | Discharge: 2014-05-13 | Disposition: A | Discharge status: Home / Self Care | Payer: Medicaid Other | Attending: Emergency Medicine | Admitting: Emergency Medicine

## 2014-05-13 ENCOUNTER — Encounter (HOSPITAL_COMMUNITY): Payer: Self-pay | Admitting: Emergency Medicine

## 2014-05-13 ENCOUNTER — Emergency Department (HOSPITAL_COMMUNITY)
Admission: EM | Admit: 2014-05-13 | Discharge: 2014-05-13 | Discharge status: Against Medical Advice | Payer: Medicaid Other | Source: Home / Self Care

## 2014-05-13 ENCOUNTER — Encounter (HOSPITAL_COMMUNITY): Payer: Self-pay | Admitting: *Deleted

## 2014-05-13 DIAGNOSIS — R011 Cardiac murmur, unspecified: Secondary | ICD-10-CM | POA: Insufficient documentation

## 2014-05-13 DIAGNOSIS — T451X5A Adverse effect of antineoplastic and immunosuppressive drugs, initial encounter: Secondary | ICD-10-CM

## 2014-05-13 DIAGNOSIS — Z4889 Encounter for other specified surgical aftercare: Secondary | ICD-10-CM

## 2014-05-13 DIAGNOSIS — Z880 Allergy status to penicillin: Secondary | ICD-10-CM | POA: Insufficient documentation

## 2014-05-13 DIAGNOSIS — Z792 Long term (current) use of antibiotics: Secondary | ICD-10-CM | POA: Diagnosis not present

## 2014-05-13 DIAGNOSIS — Z4801 Encounter for change or removal of surgical wound dressing: Secondary | ICD-10-CM | POA: Insufficient documentation

## 2014-05-13 DIAGNOSIS — Z791 Long term (current) use of non-steroidal anti-inflammatories (NSAID): Secondary | ICD-10-CM | POA: Insufficient documentation

## 2014-05-13 DIAGNOSIS — Z8572 Personal history of non-Hodgkin lymphomas: Secondary | ICD-10-CM | POA: Diagnosis not present

## 2014-05-13 DIAGNOSIS — E119 Type 2 diabetes mellitus without complications: Secondary | ICD-10-CM

## 2014-05-13 DIAGNOSIS — Z7982 Long term (current) use of aspirin: Secondary | ICD-10-CM | POA: Insufficient documentation

## 2014-05-13 DIAGNOSIS — Z7952 Long term (current) use of systemic steroids: Secondary | ICD-10-CM | POA: Insufficient documentation

## 2014-05-13 DIAGNOSIS — D6481 Anemia due to antineoplastic chemotherapy: Secondary | ICD-10-CM | POA: Diagnosis not present

## 2014-05-13 DIAGNOSIS — Z7901 Long term (current) use of anticoagulants: Secondary | ICD-10-CM | POA: Insufficient documentation

## 2014-05-13 HISTORY — DX: Non-Hodgkin lymphoma, unspecified, unspecified site: C85.90

## 2014-05-13 LAB — CBC WITH DIFFERENTIAL/PLATELET
BASOS ABS: 0 10*3/uL (ref 0.0–0.1)
BASOS PCT: 0 % (ref 0–1)
EOS PCT: 1 % (ref 0–5)
Eosinophils Absolute: 0.1 10*3/uL (ref 0.0–0.7)
HCT: 34.1 % — ABNORMAL LOW (ref 36.0–46.0)
Hemoglobin: 11.1 g/dL — ABNORMAL LOW (ref 12.0–15.0)
Lymphocytes Relative: 35 % (ref 12–46)
Lymphs Abs: 1.8 10*3/uL (ref 0.7–4.0)
MCH: 26.4 pg (ref 26.0–34.0)
MCHC: 32.6 g/dL (ref 30.0–36.0)
MCV: 81 fL (ref 78.0–100.0)
Monocytes Absolute: 0.3 10*3/uL (ref 0.1–1.0)
Monocytes Relative: 6 % (ref 3–12)
Neutro Abs: 2.9 10*3/uL (ref 1.7–7.7)
Neutrophils Relative %: 58 % (ref 43–77)
PLATELETS: 239 10*3/uL (ref 150–400)
RBC: 4.21 MIL/uL (ref 3.87–5.11)
RDW: 13 % (ref 11.5–15.5)
WBC: 5.1 10*3/uL (ref 4.0–10.5)

## 2014-05-13 NOTE — ED Notes (Signed)
Pt had right chest port-a-cath removed yesterday. Incision side starting bleeding today and pt was told to come to ED for eval. Bleeding controlled at this time. No s/s of infection at site.

## 2014-05-13 NOTE — Discharge Instructions (Signed)
Wound Check Your wound appears healthy today. Your wound will heal gradually over time. Eventually a scar will form that will fade with time. FACTORS THAT AFFECT SCAR FORMATION:  People differ in the severity in which they scar.  Scar severity varies according to location, size, and the traits you inherited from your parents (genetic predisposition).  Irritation to the wound from infection, rubbing, or chemical exposure will increase the amount of scar formation. HOME CARE INSTRUCTIONS   If you were given a dressing, you should change it at least once a day or as instructed by your caregiver. If the bandage sticks, soak it off with a solution of hydrogen peroxide.  If the bandage becomes wet, dirty, or develops a bad smell, change it as soon as possible.  Look for signs of infection.  Only take over-the-counter or prescription medicines for pain, discomfort, or fever as directed by your caregiver. SEEK IMMEDIATE MEDICAL CARE IF:   You have redness, swelling, or increasing pain in the wound.  You notice pus coming from the wound.  You have a fever.  You notice a bad smell coming from the wound or dressing. Document Released: 03/30/2004 Document Revised: 09/16/2011 Document Reviewed: 06/24/2005 [facility] Patient Information 2015 [place], [place]. This information is not intended to replace advice given to you by your health care provider. Make sure you discuss any questions you have with your health care provider.   Your hemoglobin today is 11.1.  Return here for any worsened symptoms.  Keep the incision site dry as instructed by your surgeon at [facility].

## 2014-05-13 NOTE — ED Notes (Signed)
Patient was here earlier today in fast track.  She left w/out notifying anyone.  States she left because it was "packed" in here.

## 2014-05-13 NOTE — ED Notes (Signed)
Patient with no complaints at this time. Respirations even and unlabored. Skin warm/dry. Discharge instructions reviewed with patient at this time. Patient given opportunity to voice concerns/ask questions. Patient discharged at this time and left Emergency Department with steady gait.   

## 2014-05-14 NOTE — ED Provider Notes (Signed)
CSN: [id number]     Arrival date & time 05/13/14  1907 History   First MD Initiated Contact with Patient 05/13/14 1940     Chief Complaint  Patient presents with  . Wound Check     (Consider location/radiation/quality/duration/timing/severity/associated sxs/prior Treatment) The history is provided by the patient.   Dana Melton is a 43 y.o. 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  The site was closed with dermabond.  This afternoon it started to bleed profusely for about 15 minutes, at which time it stopped after applying pressure.  She was here for evaluation of this around 2 pm, but left due to the wait time and because the site has stopped bleeding.  She was advised to return to get the site checked.  She denies fevers, chills, pain at the site or any new swelling.  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  She last received a blood transfusion one month ago as her hgb was less then 8, her oncologists threshold for transfusion.  She denies any other bleeding.  She takes xarelto.     Past Medical History  Diagnosis Date  . Murmur   . Diabetes mellitus without complication   . Lymphoma    Past Surgical History  Procedure Laterality Date  . Tubal ligation    . Portacath placement    . Portcath removal     History reviewed. No pertinent family history. History  Substance Use Topics  . Smoking status: Never Smoker   . Smokeless tobacco: Not on file  . Alcohol Use: No   OB History    No data available     Review of Systems  Constitutional: Positive for fatigue. Negative for fever.  HENT: Negative.  Negative for sore throat.   Eyes: Negative.   Respiratory: Negative for shortness of breath.   Cardiovascular: Negative for chest pain and palpitations.  Gastrointestinal: Negative for nausea.   Genitourinary: Negative.   Musculoskeletal: Negative for joint swelling, arthralgias and neck pain.  Skin: Positive for wound. Negative for rash.  Neurological: Negative for dizziness, weakness, light-headedness, numbness and headaches.  Psychiatric/Behavioral: Negative.       Allergies  Penicillins  Home Medications   Prior to Admission medications   Medication Sig Start Date End Date Taking? Authorizing Provider  aspirin EC 81 MG tablet Take 81 mg by mouth daily.   Yes Historical Provider, MD  ferrous sulfate 325 (65 FE) MG tablet Take 325 mg by mouth daily with breakfast.   Yes Historical Provider, MD  lisinopril (PRINIVIL,ZESTRIL) 20 MG tablet Take 20 mg by mouth daily.   Yes Historical Provider, MD  oxyCODONE (OXY IR/ROXICODONE) 5 MG immediate release tablet Take by mouth. 05/12/14  Yes Historical Provider, MD  polyethylene glycol powder (GLYCOLAX/MIRALAX) powder Take 17 g by mouth daily.   Yes Historical Provider, MD  rivaroxaban (XARELTO) 20 MG TABS tablet Take 20 mg by mouth daily with supper.   Yes Historical Provider, MD  senna (SENOKOT) 8.6 MG tablet Take 1 tablet by mouth daily.   Yes Historical Provider, MD  sulfamethoxazole-trimethoprim (BACTRIM,SEPTRA) 400-80 MG per tablet Take 1 tablet by mouth every Monday, Wednesday, and Friday.   Yes Historical Provider, MD  cyclobenzaprine (FLEXERIL) 5 MG tablet Take 1 tablet (5 mg total) by mouth 3 (three) times daily as needed for muscle spasms. Patient  not taking: Reported on 05/13/2014 09/24/13   Evalee Jefferson, PA-C  hydrochlorothiazide (HYDRODIURIL) 25 MG tablet Take 25 mg by mouth daily.    Historical Provider, MD  indomethacin (INDOCIN) 50 MG capsule Take 1 capsule (50 mg total) by mouth 3 (three) times daily with meals. Patient not taking: Reported on 05/13/2014 6/57/84   Delora Fuel, MD  OVER THE COUNTER MEDICATION Take 500 mg by mouth every 6 (six) hours as needed (pain).    Historical Provider, MD  oxyCODONE-acetaminophen  (PERCOCET/ROXICET) 5-325 MG per tablet Take 1 tablet by mouth every 4 (four) hours as needed for severe pain. Patient not taking: Reported on 05/13/2014 6/96/29   Delora Fuel, MD  oxyCODONE-acetaminophen (PERCOCET/ROXICET) 5-325 MG per tablet Take 1 tablet by mouth every 4 (four) hours as needed for severe pain. Patient not taking: Reported on 05/13/2014 12/03/39   Delora Fuel, MD  predniSONE (DELTASONE) 50 MG tablet Take 1 tablet (50 mg total) by mouth daily. Patient not taking: Reported on 05/13/2014 09/29/38   Delora Fuel, MD  traMADol (ULTRAM) 50 MG tablet Take 1 tablet (50 mg total) by mouth every 6 (six) hours as needed. Patient not taking: Reported on 05/13/2014 09/24/13   Evalee Jefferson, PA-C   BP 136/100 mmHg  Pulse 98  Temp(Src) 98.3 F (36.8 C) (Oral)  Resp 14  Ht 5\' 3"  (1.6 m)  Wt 205 lb (92.987 kg)  BMI 36.32 kg/m2  SpO2 100%  LMP 04/21/2014 Physical Exam  Constitutional: She is oriented to person, place, and time. She appears well-developed and well-nourished. No distress.  HENT:  Head: Normocephalic.  Cardiovascular: Normal rate.   Pulmonary/Chest: Effort normal.  Musculoskeletal: She exhibits no tenderness.  Neurological: She is alert and oriented to person, place, and time. No sensory deficit.  Skin: Skin is warm and dry.  Apparent sealed wound site right upper chest wall with scant traces of dried blood on the dermabonded wound site.  No hematoma.      ED Course  Procedures (including critical care time)   Pts wound site was cleaned with saf clens, dried thoroughly, then additional layer of dermabond applied for additional would support.   Labs Review Labs Reviewed  CBC WITH DIFFERENTIAL - Abnormal; Notable for the following:    Hemoglobin 11.1 (*)    HCT 34.1 (*)    All other components within normal limits    Imaging Review No results found.   EKG Interpretation None      MDM   Final diagnoses:  Encounter for postoperative wound check  Anemia due to  antineoplastic chemotherapy    Patients labs and/or radiological studies were viewed and considered during the medical decision making and disposition process. Pt with chronic anemia, no indication for transfusion.  Encouraged prn f/u either here or by her provider at [facility] for any further complications from this wound.  Pt understands plan.  The patient appears reasonably screened and/or stabilized for discharge and I doubt any other medical condition or other [facility] requiring further screening, evaluation, or treatment in the ED at this time prior to discharge.     Evalee Jefferson, PA-C 05/14/14 [place], MD 05/14/14 1539

## 2014-08-22 ENCOUNTER — Encounter (HOSPITAL_COMMUNITY): Payer: Self-pay | Admitting: *Deleted

## 2014-08-22 ENCOUNTER — Emergency Department (HOSPITAL_COMMUNITY)
Admission: EM | Admit: 2014-08-22 | Discharge: 2014-08-22 | Disposition: A | Discharge status: Home / Self Care | Payer: Self-pay | Attending: Emergency Medicine | Admitting: Emergency Medicine

## 2014-08-22 DIAGNOSIS — Z202 Contact with and (suspected) exposure to infections with a predominantly sexual mode of transmission: Secondary | ICD-10-CM | POA: Insufficient documentation

## 2014-08-22 DIAGNOSIS — Z791 Long term (current) use of non-steroidal anti-inflammatories (NSAID): Secondary | ICD-10-CM | POA: Insufficient documentation

## 2014-08-22 DIAGNOSIS — E119 Type 2 diabetes mellitus without complications: Secondary | ICD-10-CM | POA: Insufficient documentation

## 2014-08-22 DIAGNOSIS — B9689 Other specified bacterial agents as the cause of diseases classified elsewhere: Secondary | ICD-10-CM

## 2014-08-22 DIAGNOSIS — Z880 Allergy status to penicillin: Secondary | ICD-10-CM | POA: Insufficient documentation

## 2014-08-22 DIAGNOSIS — Z7952 Long term (current) use of systemic steroids: Secondary | ICD-10-CM | POA: Insufficient documentation

## 2014-08-22 DIAGNOSIS — Z792 Long term (current) use of antibiotics: Secondary | ICD-10-CM | POA: Insufficient documentation

## 2014-08-22 DIAGNOSIS — N760 Acute vaginitis: Secondary | ICD-10-CM | POA: Insufficient documentation

## 2014-08-22 DIAGNOSIS — Z7982 Long term (current) use of aspirin: Secondary | ICD-10-CM | POA: Insufficient documentation

## 2014-08-22 DIAGNOSIS — Z8572 Personal history of non-Hodgkin lymphomas: Secondary | ICD-10-CM | POA: Insufficient documentation

## 2014-08-22 DIAGNOSIS — Z7901 Long term (current) use of anticoagulants: Secondary | ICD-10-CM | POA: Insufficient documentation

## 2014-08-22 DIAGNOSIS — Z711 Person with feared health complaint in whom no diagnosis is made: Secondary | ICD-10-CM

## 2014-08-22 DIAGNOSIS — R011 Cardiac murmur, unspecified: Secondary | ICD-10-CM | POA: Insufficient documentation

## 2014-08-22 LAB — WET PREP, GENITAL
Trich, Wet Prep: NONE SEEN
Yeast Wet Prep HPF POC: NONE SEEN

## 2014-08-22 MED ORDER — METRONIDAZOLE 500 MG PO TABS: 500.0000 mg | ORAL_TABLET | Freq: Two times a day (BID) | ORAL | Status: DC

## 2014-08-22 MED ORDER — AZITHROMYCIN 250 MG PO TABS
1000.0000 mg | ORAL_TABLET | Freq: Once | ORAL | Status: AC
  Administered 2014-08-22: 1000 mg via ORAL
  Filled 2014-08-22: qty 4

## 2014-08-22 NOTE — ED Notes (Signed)
Pts partner being tx for std, wants to be treated also

## 2014-08-22 NOTE — ED Provider Notes (Signed)
CSN: [id number]     Arrival date & time 08/22/14  1144 History  This chart was scribed for non-physician practitioner, Debroah Baller, NP working with Nyra Jabs, DO by Tula Nakayama, ED scribe. This patient was seen in room APFT23/APFT23 and the patient's care was started at 11:56 AM   No chief complaint on file.  Patient is a 44 y.o. female presenting with STD exposure. The history is provided by the patient. No language interpreter was used.  Exposure to STD This is a new problem. The current episode started more than 2 days ago. The problem has not changed since onset.Pertinent negatives include no chest pain, no abdominal pain, no headaches and no shortness of breath. Nothing aggravates the symptoms. Nothing relieves the symptoms. She has tried nothing for the symptoms. The treatment provided no relief.    HPI Comments: Breely Panik is a 44 y.o. female with a history of DM, lymphoma and tubal ligation who presents to the Emergency Department for STD testing. Pt has had a new sex partner for 1 week. She denies any current symptoms, but her boyfriend is currently being treated for chlamydia so she wishes to be tested.  Past Medical History  Diagnosis Date  . Murmur   . Diabetes mellitus without complication   . Lymphoma    Past Surgical History  Procedure Laterality Date  . Tubal ligation    . Portacath placement    . Portcath removal     History reviewed. No pertinent family history. History  Substance Use Topics  . Smoking status: Never Smoker   . Smokeless tobacco: Not on file  . Alcohol Use: No   OB History    No data available     Review of Systems  Respiratory: Negative for shortness of breath.   Cardiovascular: Negative for chest pain.  Gastrointestinal: Negative for abdominal pain.  Genitourinary: Negative for dysuria, vaginal discharge and difficulty urinating.  Neurological: Negative for headaches.   A complete 10 system review of systems was obtained and all  systems are negative except as noted in the HPI and PMH.   Allergies  Penicillins  Home Medications   Prior to Admission medications   Medication Sig Start Date End Date Taking? Authorizing Provider  aspirin EC 81 MG tablet Take 81 mg by mouth daily.    Historical Provider, MD  cyclobenzaprine (FLEXERIL) 5 MG tablet Take 1 tablet (5 mg total) by mouth 3 (three) times daily as needed for muscle spasms. Patient not taking: Reported on 05/13/2014 09/24/13   Evalee Jefferson, PA-C  ferrous sulfate 325 (65 FE) MG tablet Take 325 mg by mouth daily with breakfast.    Historical Provider, MD  hydrochlorothiazide (HYDRODIURIL) 25 MG tablet Take 25 mg by mouth daily.    Historical Provider, MD  indomethacin (INDOCIN) 50 MG capsule Take 1 capsule (50 mg total) by mouth 3 (three) times daily with meals. Patient not taking: Reported on 05/13/2014 10/22/58   Delora Fuel, MD  lisinopril (PRINIVIL,ZESTRIL) 20 MG tablet Take 20 mg by mouth daily.    Historical Provider, MD  metroNIDAZOLE (FLAGYL) 500 MG tablet Take 1 tablet (500 mg total) by mouth 2 (two) times daily. 08/22/14   [place], NP  oxyCODONE-acetaminophen (PERCOCET/ROXICET) 5-325 MG per tablet Take 1 tablet by mouth every 4 (four) hours as needed for severe pain. Patient not taking: Reported on 05/13/2014 01/05/15   Delora Fuel, MD  oxyCODONE-acetaminophen (PERCOCET/ROXICET) 5-325 MG per tablet Take 1 tablet by mouth every 4 (  four) hours as needed for severe pain. Patient not taking: Reported on 05/13/2014 5/83/09   Delora Fuel, MD  polyethylene glycol powder (GLYCOLAX/MIRALAX) powder Take 17 g by mouth daily.    Historical Provider, MD  predniSONE (DELTASONE) 50 MG tablet Take 1 tablet (50 mg total) by mouth daily. Patient not taking: Reported on 05/13/2014 10/13/66   Delora Fuel, MD  rivaroxaban (XARELTO) 20 MG TABS tablet Take 20 mg by mouth daily with supper.    Historical Provider, MD  senna (SENOKOT) 8.6 MG tablet Take 1 tablet by mouth daily.     Historical Provider, MD  sulfamethoxazole-trimethoprim (BACTRIM,SEPTRA) 400-80 MG per tablet Take 1 tablet by mouth every Monday, Wednesday, and Friday.    Historical Provider, MD  traMADol (ULTRAM) 50 MG tablet Take 1 tablet (50 mg total) by mouth every 6 (six) hours as needed. Patient not taking: Reported on 05/13/2014 09/24/13   Evalee Jefferson, PA-C   BP 139/89 mmHg  Pulse 78  Temp(Src) 98.9 F (37.2 C) (Oral)  Resp 18  Ht 5\' 3"  (1.6 m)  Wt 205 lb (92.987 kg)  BMI 36.32 kg/m2  SpO2 100%  LMP 06/21/2014 Physical Exam  Constitutional: She is oriented to person, place, and time. She appears well-developed and well-nourished. No distress.  HENT:  Head: Normocephalic and atraumatic.  Eyes: Conjunctivae and EOM are normal.  Cardiovascular: Normal rate and regular rhythm.   Pulmonary/Chest: Effort normal and breath sounds normal. No stridor. No respiratory distress.  Abdominal: Soft. She exhibits no distension. There is no tenderness.  Genitourinary:  External genitalia without lesions; yellow discharge in vaginal vault; no cervical motion tenderness; no adnexal tenderness or mass palpable; uterus without palpable enlargement; inguinal nodes without enlargement  Musculoskeletal: She exhibits no edema.  Neurological: She is alert and oriented to person, place, and time. No cranial nerve deficit.  Skin: Skin is warm and dry.  Psychiatric: She has a normal mood and affect.  Nursing note and vitals reviewed.   ED Course  Procedures (including critical care time) DIAGNOSTIC STUDIES: Oxygen Saturation is 100% on RA, normal by my interpretation.    COORDINATION OF CARE: 12:03 PM Discussed treatment plan with pt which includes lab work. She agreed to plan. Results for orders placed or performed during the hospital encounter of 08/22/14 (from the past 24 hour(s))  Wet prep, genital     Status: Abnormal   Collection Time: 08/22/14 12:07 PM  Result Value Ref Range   Yeast Wet Prep HPF POC NONE  SEEN NONE SEEN   Trich, Wet Prep NONE SEEN NONE SEEN   Clue Cells Wet Prep HPF POC MANY (A) NONE SEEN   WBC, Wet Prep HPF POC MANY (A) NONE SEEN     MDM  44 y.o. female with possible STI exposure and request for treatment. Stable for d/c without fever or signs of PID. Will treat for Bacterial vaginosis and will give Zithromax. Cultures for GC and Chlamydia pending.   Final diagnoses:  Concern about STD in female without diagnosis  Bacterial vaginosis   I personally performed the services described in this documentation, which was scribed in my presence. The recorded information has been reviewed and is accurate.    [facility] Bunnie Pion, NP 08/22/14 [place], DO 08/22/14 1322

## 2014-08-23 LAB — RPR: RPR Ser Ql: NONREACTIVE

## 2014-08-23 LAB — GC/CHLAMYDIA PROBE AMP (~~LOC~~) NOT AT ARMC
CHLAMYDIA, DNA PROBE: NEGATIVE
NEISSERIA GONORRHEA: NEGATIVE

## 2014-08-23 LAB — HIV ANTIBODY (ROUTINE TESTING W REFLEX): HIV SCREEN 4TH GENERATION: NONREACTIVE

## 2014-10-07 ENCOUNTER — Encounter (HOSPITAL_COMMUNITY): Payer: Self-pay

## 2014-10-07 ENCOUNTER — Emergency Department (HOSPITAL_COMMUNITY)
Admission: EM | Admit: 2014-10-07 | Discharge: 2014-10-07 | Disposition: A | Discharge status: Home / Self Care | Payer: Medicaid Other | Attending: Emergency Medicine | Admitting: Emergency Medicine

## 2014-10-07 DIAGNOSIS — Z7982 Long term (current) use of aspirin: Secondary | ICD-10-CM | POA: Insufficient documentation

## 2014-10-07 DIAGNOSIS — Z79899 Other long term (current) drug therapy: Secondary | ICD-10-CM | POA: Insufficient documentation

## 2014-10-07 DIAGNOSIS — L02412 Cutaneous abscess of left axilla: Secondary | ICD-10-CM | POA: Insufficient documentation

## 2014-10-07 DIAGNOSIS — Z792 Long term (current) use of antibiotics: Secondary | ICD-10-CM | POA: Insufficient documentation

## 2014-10-07 DIAGNOSIS — E119 Type 2 diabetes mellitus without complications: Secondary | ICD-10-CM | POA: Insufficient documentation

## 2014-10-07 DIAGNOSIS — Z791 Long term (current) use of non-steroidal anti-inflammatories (NSAID): Secondary | ICD-10-CM | POA: Insufficient documentation

## 2014-10-07 DIAGNOSIS — Z880 Allergy status to penicillin: Secondary | ICD-10-CM | POA: Insufficient documentation

## 2014-10-07 DIAGNOSIS — Z8572 Personal history of non-Hodgkin lymphomas: Secondary | ICD-10-CM | POA: Insufficient documentation

## 2014-10-07 DIAGNOSIS — R011 Cardiac murmur, unspecified: Secondary | ICD-10-CM | POA: Insufficient documentation

## 2014-10-07 MED ORDER — HYDROCODONE-ACETAMINOPHEN 5-325 MG PO TABS: 1.0000 | ORAL_TABLET | Freq: Four times a day (QID) | ORAL | Status: AC | PRN

## 2014-10-07 MED ORDER — HYDROCODONE-ACETAMINOPHEN 5-325 MG PO TABS
1.0000 | ORAL_TABLET | Freq: Once | ORAL | Status: AC
Start: 2014-10-07 — End: 2014-10-07
  Administered 2014-10-07: 1 via ORAL
  Filled 2014-10-07: qty 1

## 2014-10-07 MED ORDER — MORPHINE SULFATE 4 MG/ML IJ SOLN
4.0000 mg | Freq: Once | INTRAMUSCULAR | Status: AC
  Administered 2014-10-07: 4 mg via INTRAMUSCULAR
  Filled 2014-10-07: qty 1

## 2014-10-07 MED ORDER — SULFAMETHOXAZOLE-TRIMETHOPRIM 800-160 MG PO TABS: 1.0000 | ORAL_TABLET | Freq: Two times a day (BID) | ORAL | Status: DC

## 2014-10-07 MED ORDER — BUPIVACAINE HCL (PF) 0.5 % IJ SOLN
10.0000 mL | Freq: Once | INTRAMUSCULAR | Status: AC
  Administered 2014-10-07: 10 mL
  Filled 2014-10-07: qty 30

## 2014-10-07 NOTE — ED Notes (Signed)
Pt reports she has a boil/cyst to under her left armpit for several days, states she has had them before and had to be opened.

## 2014-10-07 NOTE — ED Provider Notes (Signed)
CSN: [id number]     Arrival date & time 10/07/14  0111 History   First MD Initiated Contact with Patient 10/07/14 0125     Chief Complaint  Patient presents with  . Recurrent Skin Infections     (Consider location/radiation/quality/duration/timing/severity/associated sxs/prior Treatment) HPI  Patient reports history of non-Hodgkin's lymphoma and states she has been in remission for about 90 days. She states she had a abscess in her left axilla once before. She states this is the third day she started getting pain and increasing swelling underneath her left arm. She normally has some swelling but it got worse today. She had her lymph node biopsy done from this area in the past. She denies any fever. She states she does use deodorant and we discussed using the unscented deodorant so hopefully she will get them again. She states she has been off her xarelto for about one month. She was on it for DVT.  PCP Dr Glean Salen at [facility])   Past Medical History  Diagnosis Date  . Murmur   . Diabetes mellitus without complication   . Lymphoma    Past Surgical History  Procedure Laterality Date  . Tubal ligation    . Portacath placement    . Portcath removal     No family history on file. History  Substance Use Topics  . Smoking status: Never Smoker   . Smokeless tobacco: Not on file  . Alcohol Use: No   OB History    No data available     Review of Systems  All other systems reviewed and are negative.     Allergies  Penicillins  Home Medications   Prior to Admission medications   Medication Sig Start Date End Date Taking? Authorizing Provider  aspirin EC 81 MG tablet Take 81 mg by mouth daily.   Yes Historical Provider, MD  cyclobenzaprine (FLEXERIL) 5 MG tablet Take 1 tablet (5 mg total) by mouth 3 (three) times daily as needed for muscle spasms. 09/24/13  Yes Evalee Jefferson, PA-C  ferrous sulfate 325 (65 FE) MG tablet Take 325 mg by mouth daily with breakfast.   Yes  Historical Provider, MD  hydrochlorothiazide (HYDRODIURIL) 25 MG tablet Take 25 mg by mouth daily.   Yes Historical Provider, MD  lisinopril (PRINIVIL,ZESTRIL) 20 MG tablet Take 20 mg by mouth daily.   Yes Historical Provider, MD  oxyCODONE-acetaminophen (PERCOCET/ROXICET) 5-325 MG per tablet Take 1 tablet by mouth every 4 (four) hours as needed for severe pain. 7/82/95  Yes Delora Fuel, MD  polyethylene glycol powder (GLYCOLAX/MIRALAX) powder Take 17 g by mouth daily.   Yes Historical Provider, MD  predniSONE (DELTASONE) 50 MG tablet Take 1 tablet (50 mg total) by mouth daily. 12/26/28  Yes Delora Fuel, MD  rivaroxaban (XARELTO) 20 MG TABS tablet Take 20 mg by mouth daily with supper.   Yes Historical Provider, MD  senna (SENOKOT) 8.6 MG tablet Take 1 tablet by mouth daily.   Yes Historical Provider, MD  sulfamethoxazole-trimethoprim (BACTRIM,SEPTRA) 400-80 MG per tablet Take 1 tablet by mouth every Monday, Wednesday, and Friday.   Yes Historical Provider, MD  indomethacin (INDOCIN) 50 MG capsule Take 1 capsule (50 mg total) by mouth 3 (three) times daily with meals. Patient not taking: Reported on 05/13/2014 8/65/78   Delora Fuel, MD  metroNIDAZOLE (FLAGYL) 500 MG tablet Take 1 tablet (500 mg total) by mouth 2 (two) times daily. 08/22/14   [place], NP  oxyCODONE-acetaminophen (PERCOCET/ROXICET) 5-325 MG per tablet Take  1 tablet by mouth every 4 (four) hours as needed for severe pain. Patient not taking: Reported on 05/13/2014 9/32/35   Delora Fuel, MD  traMADol (ULTRAM) 50 MG tablet Take 1 tablet (50 mg total) by mouth every 6 (six) hours as needed. Patient not taking: Reported on 05/13/2014 09/24/13   Evalee Jefferson, PA-C   BP 126/93 mmHg  Pulse 90  Temp(Src) 98.9 F (37.2 C) (Oral)  Resp 20  Ht 5\' 3"  (1.6 m)  Wt 205 lb (92.987 kg)  BMI 36.32 kg/m2  SpO2 98%  Vital signs normal   Physical Exam  Constitutional: She is oriented to person, place, and time. She appears well-developed and  well-nourished.  Non-toxic appearance. She does not appear ill. No distress.  HENT:  Head: Normocephalic and atraumatic.  Right Ear: External ear normal.  Left Ear: External ear normal.  Nose: Nose normal. No mucosal edema or rhinorrhea.  Mouth/Throat: Mucous membranes are normal. No dental abscesses or uvula swelling.  Eyes: Conjunctivae and EOM are normal. Pupils are equal, round, and reactive to light.  Neck: Normal range of motion and full passive range of motion without pain. Neck supple.  Pulmonary/Chest: Effort normal. No respiratory distress. She has no rhonchi. She exhibits no crepitus.  Abdominal: Normal appearance.  Musculoskeletal: Normal range of motion. She exhibits no edema or tenderness.  Moves all extremities well.   Neurological: She is alert and oriented to person, place, and time. She has normal strength. No cranial nerve deficit.  Skin: Skin is warm, dry and intact. No rash noted. No erythema. No pallor.  Patient has a large swelling underneath her left axilla. It is very soft closer to the chest wall. There is an area of redness and induration that is in the inner part of the axilla. This is where she has the pain.  Psychiatric: She has a normal mood and affect. Her speech is normal and behavior is normal. Her mood appears not anxious.  Nursing note and vitals reviewed.      ED Course  Procedures (including critical care time)  Medications  HYDROcodone-acetaminophen (NORCO/VICODIN) 5-325 MG per tablet 1 tablet (not administered)  morphine 4 MG/ML injection 4 mg (4 mg Intramuscular Given 10/07/14 0218)  bupivacaine (MARCAINE) 0.5 % injection 10 mL (10 mLs Infiltration Given by Other 10/07/14 0219)   INCISION AND DRAINAGE Performed by: TDDUK,GUR L Consent: Verbal consent obtained. Risks and benefits: risks, benefits and alternatives were discussed Type: abscess  Body area: left axilla  Anesthesia: local infiltration  Incision was made with a 11 blade  scalpel.  Local anesthetic: marcaine 0.5 %  Anesthetic total: 3 ml  Complexity: complex Blunt dissection to break up loculations  Drainage: purulent  Drainage amount: moderate  Packing material: 1/4 in iodoform gauze  Patient tolerance: Patient tolerated the procedure well with no immediate complications.      Labs Review Labs Reviewed - No data to display  Imaging Review No results found.   EKG Interpretation None      MDM   Final diagnoses:  Cutaneous abscess of left axilla    New Prescriptions   HYDROCODONE-ACETAMINOPHEN (NORCO/VICODIN) 5-325 MG PER TABLET    Take 1 tablet by mouth every 6 (six) hours as needed for moderate pain.   SULFAMETHOXAZOLE-TRIMETHOPRIM (BACTRIM DS,SEPTRA DS) 800-160 MG PER TABLET    Take 1 tablet by mouth 2 (two) times daily.     Plan discharge  Rolland Porter, MD, Barbette Or, MD 10/07/14 [phone]

## 2014-10-07 NOTE — Discharge Instructions (Signed)
Continue the warm compresses. Take the hydrocodone for pain with ibuprofen 600 mg 4 times a day. The packing needs to be removed in 2 days, you can pull it out in the shower or tub.  Recheck if it seems to be getting worse instead of better.

## 2014-11-09 ENCOUNTER — Encounter (HOSPITAL_COMMUNITY): Payer: Self-pay

## 2014-11-09 DIAGNOSIS — L089 Local infection of the skin and subcutaneous tissue, unspecified: Secondary | ICD-10-CM | POA: Insufficient documentation

## 2014-11-09 DIAGNOSIS — R011 Cardiac murmur, unspecified: Secondary | ICD-10-CM | POA: Insufficient documentation

## 2014-11-09 NOTE — ED Notes (Signed)
Pt reports she has a boil under her left armpit that has drained at some point but continues to be painful

## 2014-11-10 ENCOUNTER — Emergency Department (HOSPITAL_COMMUNITY)
Admission: EM | Admit: 2014-11-10 | Discharge: 2014-11-10 | Discharge status: Against Medical Advice | Payer: Medicaid Other | Attending: Emergency Medicine | Admitting: Emergency Medicine

## 2014-11-10 NOTE — ED Notes (Signed)
Unable to locate pt in all waiting areas 

## 2014-11-10 NOTE — ED Notes (Signed)
Unable to locate pt in any waiting areas x 3

## 2015-05-19 IMAGING — CR DG SHOULDER 2+V*L*
3 series · 3 of 3 positions shown · non-contrast
Comparison: No priors.

CLINICAL DATA: Left-sided shoulder pain for the past month.

EXAM:
LEFT SHOULDER - 2+ VIEW

[view not recorded (1 of 3)]
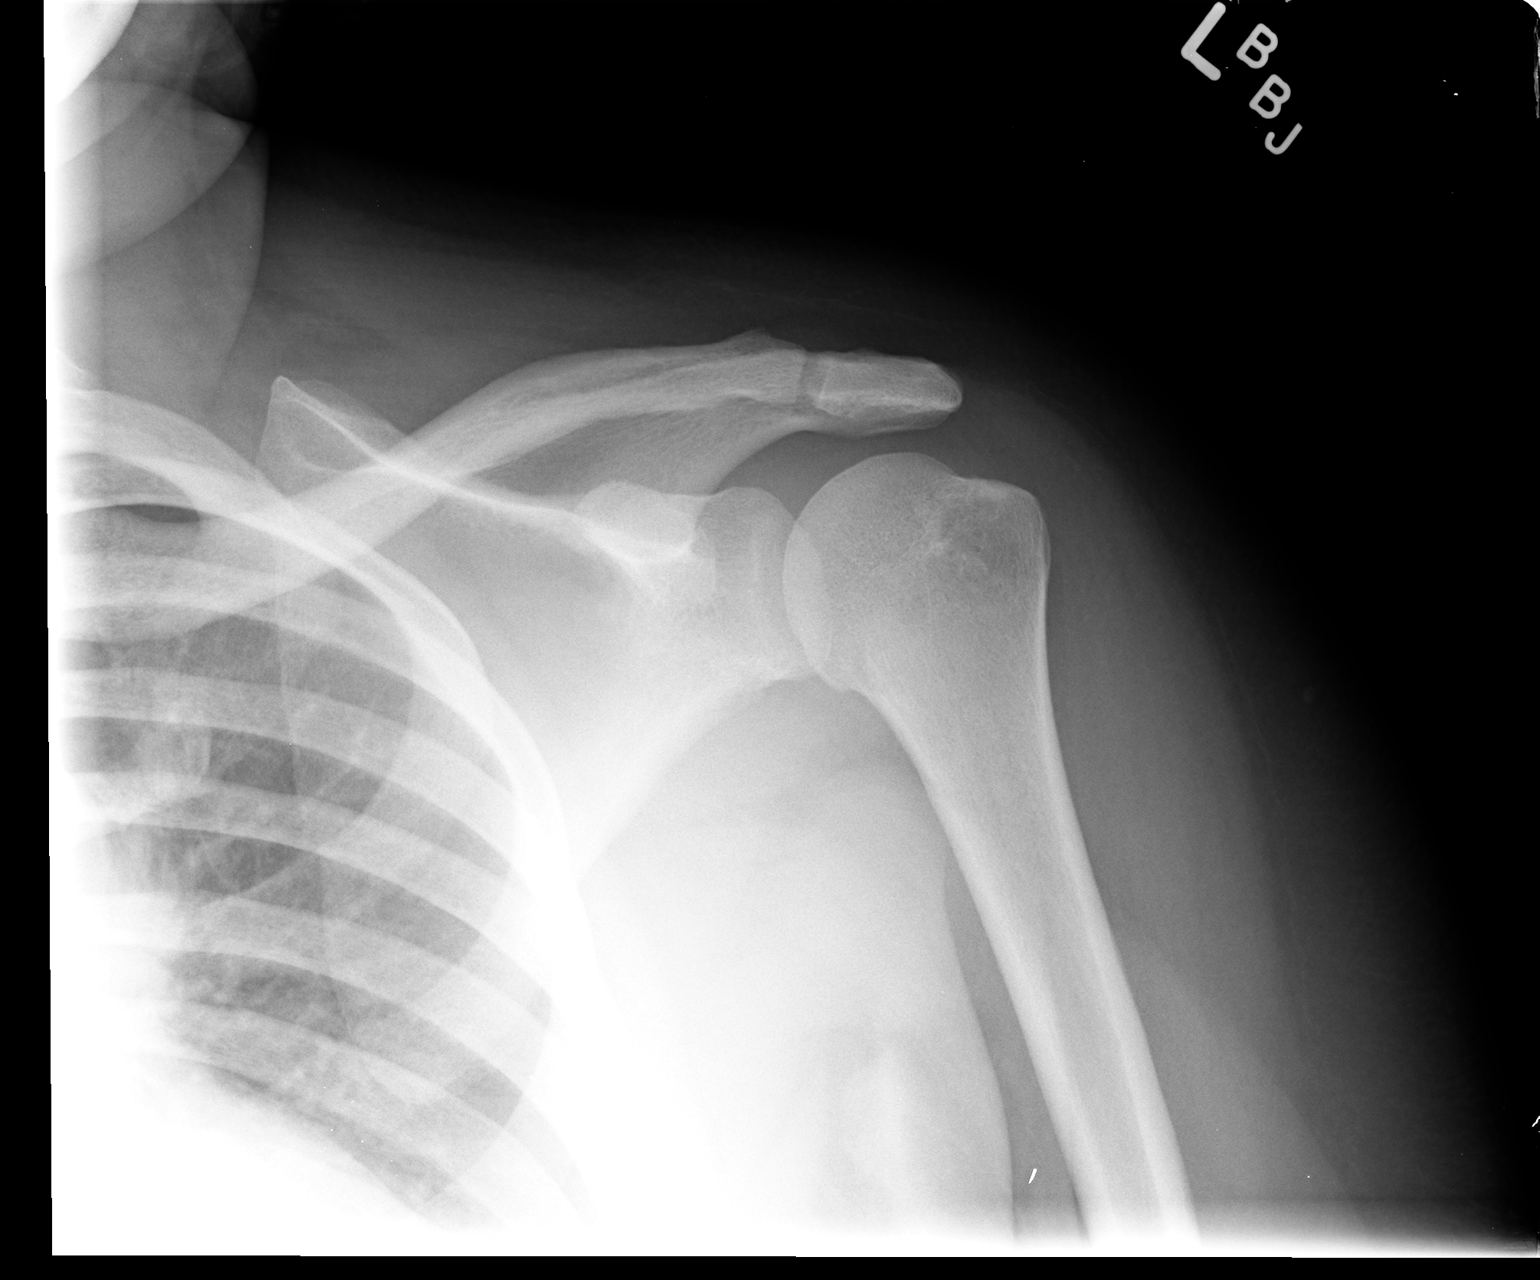

[view not recorded (2 of 3)]
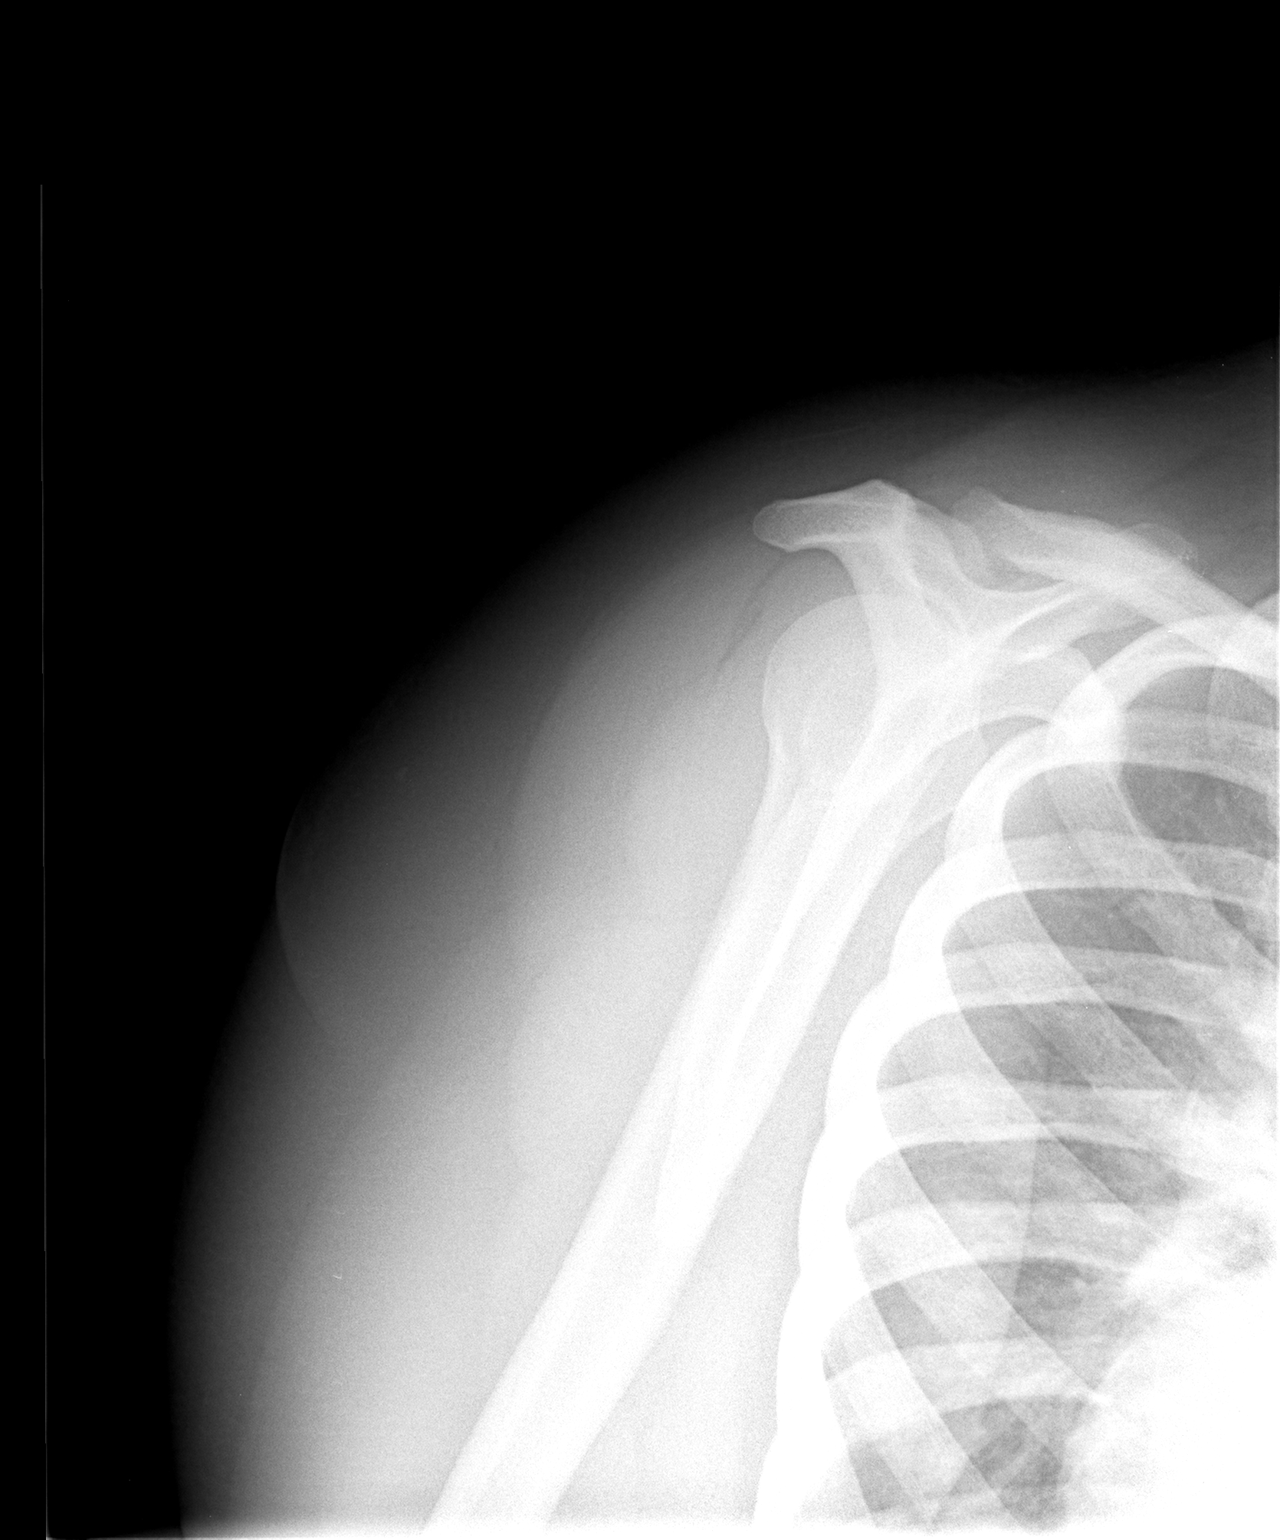

[view not recorded (3 of 3)]
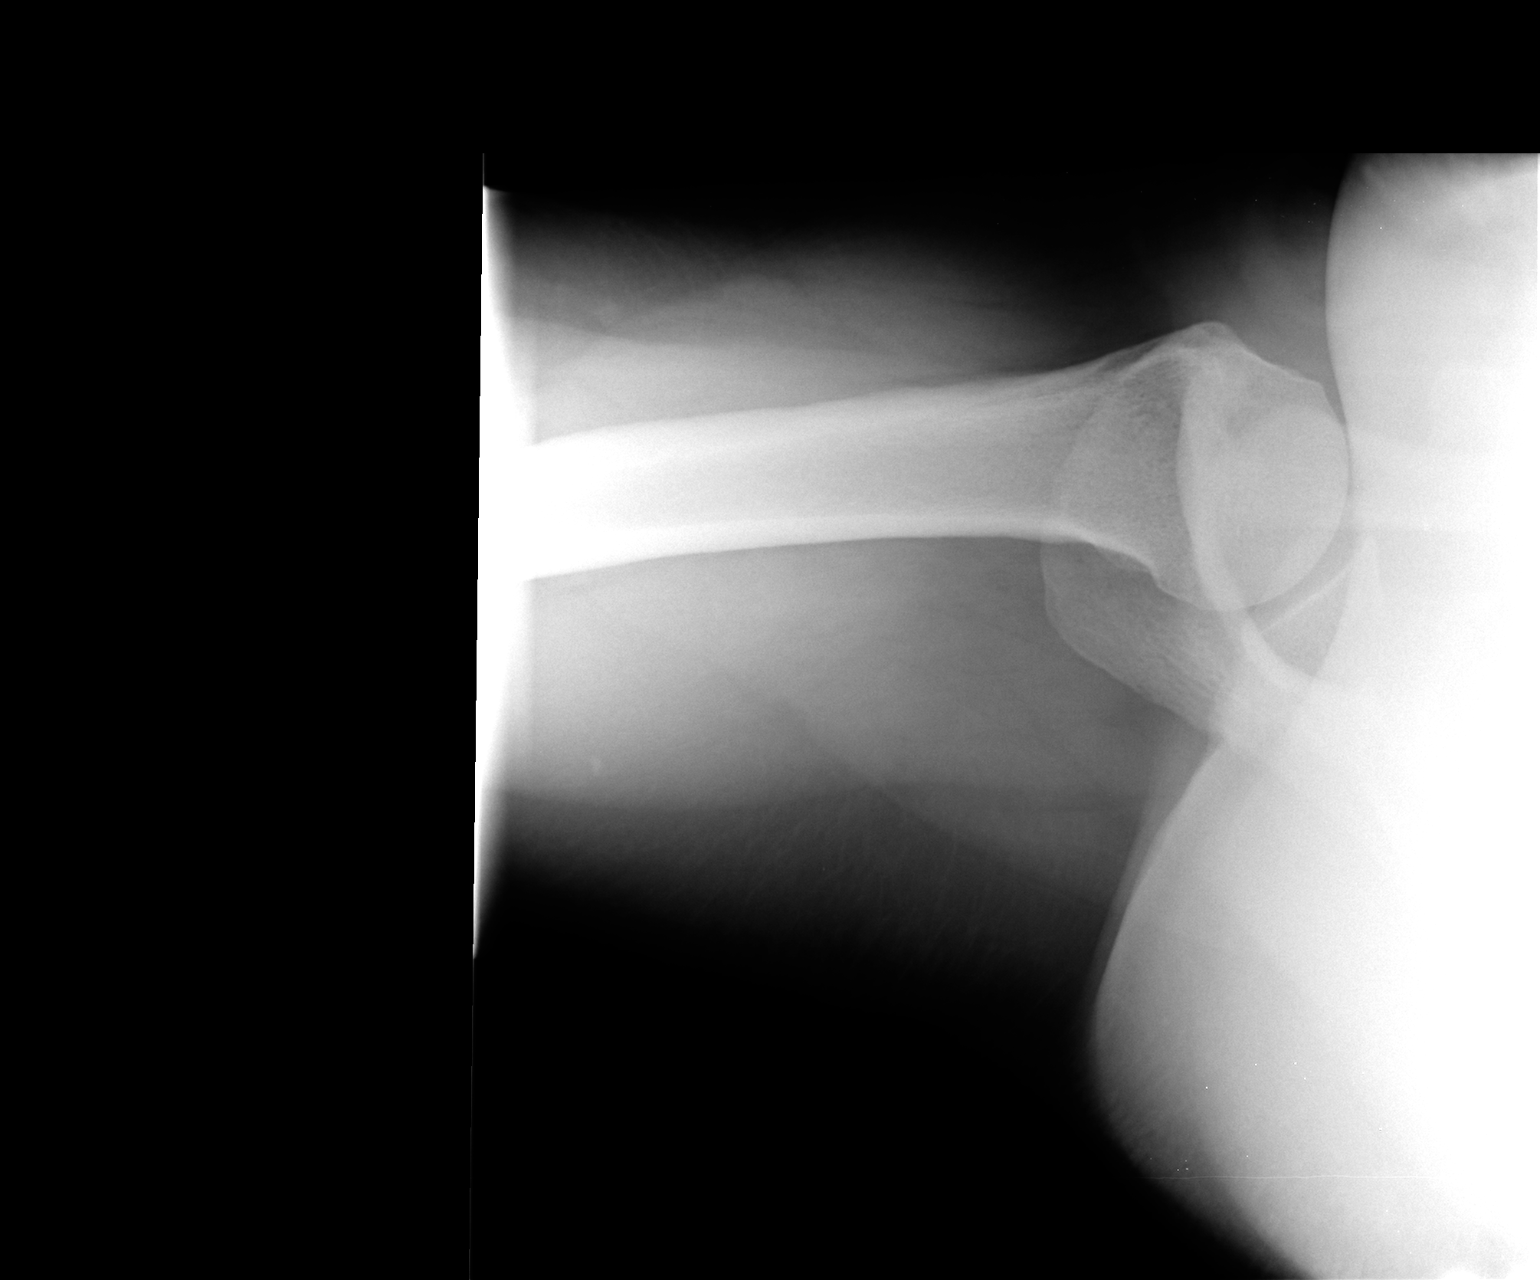

[3 of 3 positions shown; findings below may reference images not displayed]

FINDINGS: Multiple views of the left shoulder demonstrate no acute displaced
fracture, subluxation, dislocation, or soft tissue abnormality.
IMPRESSION: No acute radiographic abnormality of the left shoulder.

## 2016-08-15 ENCOUNTER — Emergency Department (HOSPITAL_COMMUNITY)
Admission: EM | Admit: 2016-08-15 | Discharge: 2016-08-15 | Disposition: A | Discharge status: Home / Self Care | Payer: 59 | Attending: Emergency Medicine | Admitting: Emergency Medicine

## 2016-08-15 ENCOUNTER — Encounter (HOSPITAL_COMMUNITY): Payer: Self-pay | Admitting: *Deleted

## 2016-08-15 ENCOUNTER — Emergency Department (HOSPITAL_COMMUNITY): Payer: 59

## 2016-08-15 DIAGNOSIS — Z7982 Long term (current) use of aspirin: Secondary | ICD-10-CM | POA: Insufficient documentation

## 2016-08-15 DIAGNOSIS — J111 Influenza due to unidentified influenza virus with other respiratory manifestations: Secondary | ICD-10-CM | POA: Diagnosis not present

## 2016-08-15 DIAGNOSIS — E860 Dehydration: Secondary | ICD-10-CM | POA: Insufficient documentation

## 2016-08-15 DIAGNOSIS — R1110 Vomiting, unspecified: Secondary | ICD-10-CM

## 2016-08-15 LAB — URINALYSIS, ROUTINE W REFLEX MICROSCOPIC
BILIRUBIN URINE: NEGATIVE
Bacteria, UA: NONE SEEN
Glucose, UA: 150 mg/dL — AB
Ketones, ur: NEGATIVE mg/dL
Leukocytes, UA: NEGATIVE
NITRITE: NEGATIVE
PROTEIN: 30 mg/dL — AB
Specific Gravity, Urine: 1.03 (ref 1.005–1.030)
pH: 5 (ref 5.0–8.0)

## 2016-08-15 LAB — CBC
HCT: 34.1 % — ABNORMAL LOW (ref 36.0–46.0)
Hemoglobin: 10.8 g/dL — ABNORMAL LOW (ref 12.0–15.0)
MCH: 24 pg — ABNORMAL LOW (ref 26.0–34.0)
MCHC: 31.7 g/dL (ref 30.0–36.0)
MCV: 75.8 fL — ABNORMAL LOW (ref 78.0–100.0)
Platelets: 227 10*3/uL (ref 150–400)
RBC: 4.5 MIL/uL (ref 3.87–5.11)
RDW: 15.3 % (ref 11.5–15.5)
WBC: 4.4 10*3/uL (ref 4.0–10.5)

## 2016-08-15 LAB — COMPREHENSIVE METABOLIC PANEL
ALK PHOS: 63 U/L (ref 38–126)
ALT: 37 U/L (ref 14–54)
ANION GAP: 7 (ref 5–15)
AST: 29 U/L (ref 15–41)
Albumin: 4.1 g/dL (ref 3.5–5.0)
BUN: 15 mg/dL (ref 6–20)
CALCIUM: 8.6 mg/dL — AB (ref 8.9–10.3)
CO2: 27 mmol/L (ref 22–32)
Chloride: 104 mmol/L (ref 101–111)
Creatinine, Ser: 0.73 mg/dL (ref 0.44–1.00)
GFR calc Af Amer: >60 mL/min (ref >60)
GFR calc non Af Amer: >60 mL/min (ref >60)
Glucose, Bld: 111 mg/dL — ABNORMAL HIGH (ref 65–99)
Potassium: 3.7 mmol/L (ref 3.5–5.1)
SODIUM: 138 mmol/L (ref 135–145)
TOTAL PROTEIN: 7.1 g/dL (ref 6.5–8.1)
Total Bilirubin: 0.4 mg/dL (ref 0.3–1.2)

## 2016-08-15 LAB — LIPASE, BLOOD: Lipase: 18 U/L (ref 11–51)

## 2016-08-15 MED ORDER — BENZONATATE 100 MG PO CAPS
100.0000 mg | ORAL_CAPSULE | Freq: Once | ORAL | Status: AC
  Administered 2016-08-15: 100 mg via ORAL
  Filled 2016-08-15: qty 1

## 2016-08-15 MED ORDER — ONDANSETRON HCL 4 MG/2ML IJ SOLN
4.0000 mg | Freq: Once | INTRAMUSCULAR | Status: AC
  Administered 2016-08-15: 4 mg via INTRAVENOUS
  Filled 2016-08-15: qty 2

## 2016-08-15 MED ORDER — ONDANSETRON HCL 4 MG PO TABS: 4.0000 mg | ORAL_TABLET | Freq: Three times a day (TID) | ORAL | 0 refills | Status: AC | PRN

## 2016-08-15 MED ORDER — BENZONATATE 100 MG PO CAPS: 100.0000 mg | ORAL_CAPSULE | Freq: Three times a day (TID) | ORAL | 0 refills | Status: AC

## 2016-08-15 MED ORDER — SODIUM CHLORIDE 0.9 % IV BOLUS (SEPSIS)
1000.0000 mL | Freq: Once | INTRAVENOUS | Status: AC
  Administered 2016-08-15: 1000 mL via INTRAVENOUS

## 2016-08-15 NOTE — ED Triage Notes (Addendum)
Pt c/o bodyaches, diarrhea, vomiting, cold chills, fever that started Monday. Pt saw PCP and wasn't tested for the flu, but treated her with Tamiflu. 4 episodes of vomiting and 10 plus episodes of diarrhea in the last 24 hours.

## 2016-08-15 NOTE — ED Notes (Signed)
Pt tolerating fluids at this time.  

## 2016-08-15 NOTE — ED Provider Notes (Signed)
[facility] DEPT Provider Note   CSN: [id number] Arrival date & time: 08/15/16  1304     History   Chief Complaint Chief Complaint  Patient presents with  . Emesis    HPI Dana Melton is a 46 y.o. female.   Emesis   This is a new problem. The current episode started 2 days ago. The problem occurs 5 to 10 times per day. The problem has not changed since onset.The emesis has an appearance of stomach contents. The maximum temperature recorded prior to her arrival was 101 to 101.9 F. The fever has been present for 3 to 4 days. Associated symptoms include abdominal pain and a fever. Pertinent negatives include no chills.    Past Medical History:  Diagnosis Date  . Lymphoma ([place])   . Murmur     There are no active problems to display for this patient.   Past Surgical History:  Procedure Laterality Date  . PORTACATH PLACEMENT    . portcath removal    . TUBAL LIGATION      OB History    No data available       Home Medications    Prior to Admission medications   Medication Sig Start Date End Date Taking? Authorizing Provider  aspirin EC 81 MG tablet Take 81 mg by mouth daily.   Yes Historical Provider, MD  ferrous sulfate 325 (65 FE) MG tablet Take 325 mg by mouth daily with breakfast.   Yes Historical Provider, MD  HYDROcodone-acetaminophen (NORCO/VICODIN) 5-325 MG per tablet Take 1 tablet by mouth every 6 (six) hours as needed for moderate pain. 10/07/14  Yes Rolland Porter, MD  naproxen (NAPROSYN) 500 MG tablet Take 500 mg by mouth 2 (two) times daily with a meal.   Yes Historical Provider, MD  senna (SENOKOT) 8.6 MG tablet Take 1 tablet by mouth daily.   Yes Historical Provider, MD  benzonatate (TESSALON) 100 MG capsule Take 1 capsule (100 mg total) by mouth every 8 (eight) hours. 08/15/16   Merrily Pew, MD  ondansetron (ZOFRAN) 4 MG tablet Take 1 tablet (4 mg total) by mouth every 8 (eight) hours as needed for nausea or vomiting. 08/15/16   Merrily Pew, MD    Family  History No family history on file.  Social History Social History  Substance Use Topics  . Smoking status: Never Smoker  . Smokeless tobacco: Never Used  . Alcohol use No     Allergies   Penicillins   Review of Systems Review of Systems  Constitutional: Positive for fever. Negative for chills.  HENT: Positive for congestion.   Respiratory: Positive for shortness of breath.   Cardiovascular: Positive for chest pain.  Gastrointestinal: Positive for abdominal pain and vomiting.  All other systems reviewed and are negative.    Physical Exam Updated Vital Signs BP 120/74 (BP Location: Right Arm)   Pulse 85   Temp 98.7 F (37.1 C) (Oral)   Resp 20   Ht 5\' 3"  (1.6 m)   Wt 240 lb (108.9 kg)   SpO2 100%   BMI 42.51 kg/m   Physical Exam  Constitutional: She is oriented to person, place, and time. She appears well-developed and well-nourished.  HENT:  Head: Normocephalic and atraumatic.  Eyes: Conjunctivae and EOM are normal.  Neck: Normal range of motion.  Cardiovascular: Normal rate and regular rhythm.   Pulmonary/Chest: Effort normal. No stridor. No respiratory distress. She has no wheezes.  Abdominal: Soft. She exhibits no distension.  Neurological: She is alert  and oriented to person, place, and time. She displays normal reflexes. No cranial nerve deficit.  Skin: Skin is warm and dry.  Nursing note and vitals reviewed.    ED Treatments / Results  Labs (all labs ordered are listed, but only abnormal results are displayed) Labs Reviewed  COMPREHENSIVE METABOLIC PANEL - Abnormal; Notable for the following:       Result Value   Glucose, Bld 111 (*)    Calcium 8.6 (*)    All other components within normal limits  CBC - Abnormal; Notable for the following:    Hemoglobin 10.8 (*)    HCT 34.1 (*)    MCV 75.8 (*)    MCH 24.0 (*)    All other components within normal limits  URINALYSIS, ROUTINE W REFLEX MICROSCOPIC - Abnormal; Notable for the following:     Glucose, UA 150 (*)    Hgb urine dipstick SMALL (*)    Protein, ur 30 (*)    All other components within normal limits  LIPASE, BLOOD    EKG  EKG Interpretation None       Radiology Dg Chest 2 View  Result Date: 08/15/2016 CLINICAL DATA:  Cough and congestion.  Fever.  History of lymphoma EXAM: CHEST  2 VIEW COMPARISON:  None. FINDINGS: Lungs are clear. Heart size and pulmonary vascularity are normal. No adenopathy appreciable. Surgical clips overlie the aortic arch. No bone lesions. IMPRESSION: No edema or consolidation.  No evident adenopathy. Electronically Signed   By: Lowella Grip III M.D.   On: 08/15/2016 16:03    Procedures Procedures (including critical care time)  Medications Ordered in ED Medications  sodium chloride 0.9 % bolus 1,000 mL (1,000 mLs Intravenous New Bag/Given 08/15/16 1545)  ondansetron (ZOFRAN) injection 4 mg (4 mg Intravenous Given 08/15/16 1546)  benzonatate (TESSALON) capsule 100 mg (100 mg Oral Given 08/15/16 1546)     Initial Impression / Assessment and Plan / ED Course  I have reviewed the triage vital signs and the nursing notes.  Pertinent labs & imaging results that were available during my care of the patient were reviewed by me and considered in my medical decision making (see chart for details).     Labs reassuring, repeat abdominal exams reassuring. Cough improved. Tolerating PO. Other symptoms improved. I think this is likely related to tamiflu, however will treat with zofran at home and stop tamiflu. Doubt abdominal causes with persistent benign abdomen and normal labs.   Final Clinical Impressions(s) / ED Diagnoses   Final diagnoses:  Influenza  Dehydration  Vomiting, intractability of vomiting not specified, presence of nausea not specified, unspecified vomiting type    New Prescriptions New Prescriptions   BENZONATATE (TESSALON) 100 MG CAPSULE    Take 1 capsule (100 mg total) by mouth every 8 (eight) hours.   ONDANSETRON  (ZOFRAN) 4 MG TABLET    Take 1 tablet (4 mg total) by mouth every 8 (eight) hours as needed for nausea or vomiting.     Merrily Pew, MD 08/15/16 [phone]

## 2018-04-02 IMAGING — DX DG CHEST 2V
2 series · 2 of 2 positions shown · non-contrast
Comparison: None.

CLINICAL DATA: Cough and congestion.  Fever.  History of lymphoma

EXAM:
CHEST  2 VIEW

[chest pa]
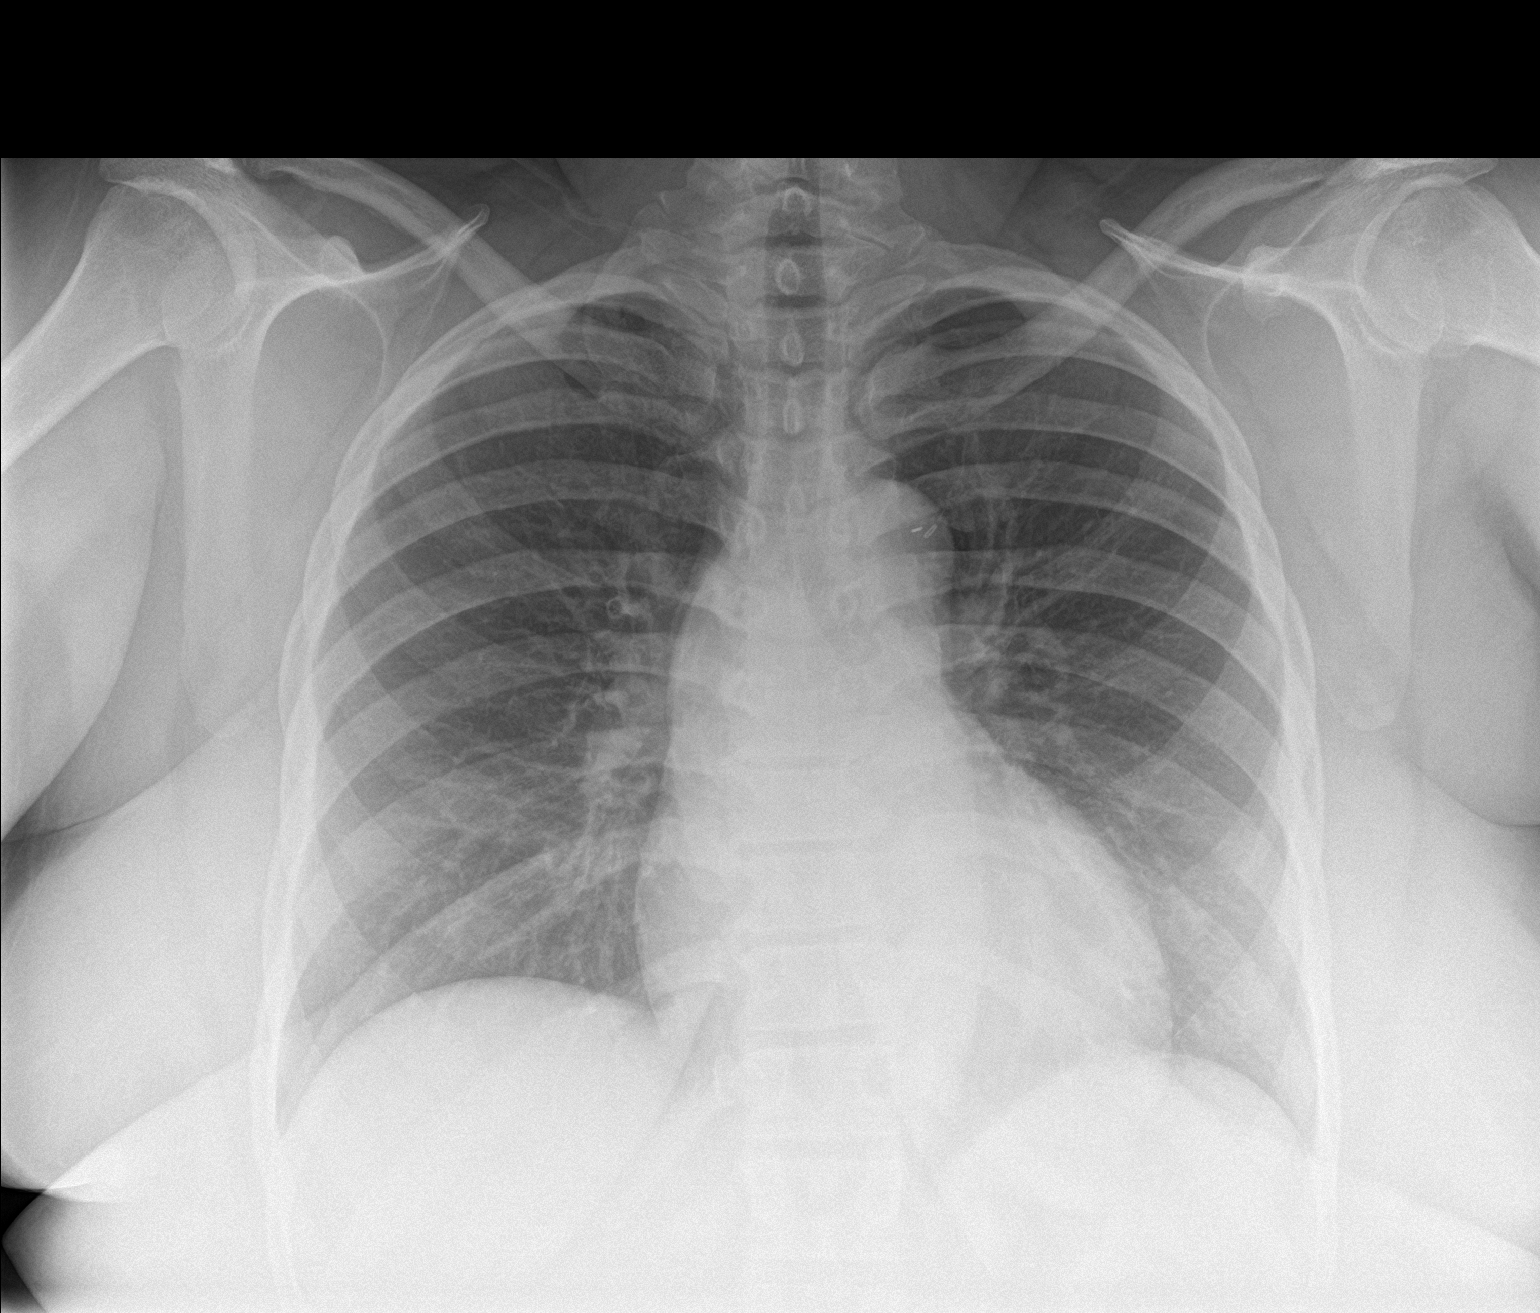

[chest lat]
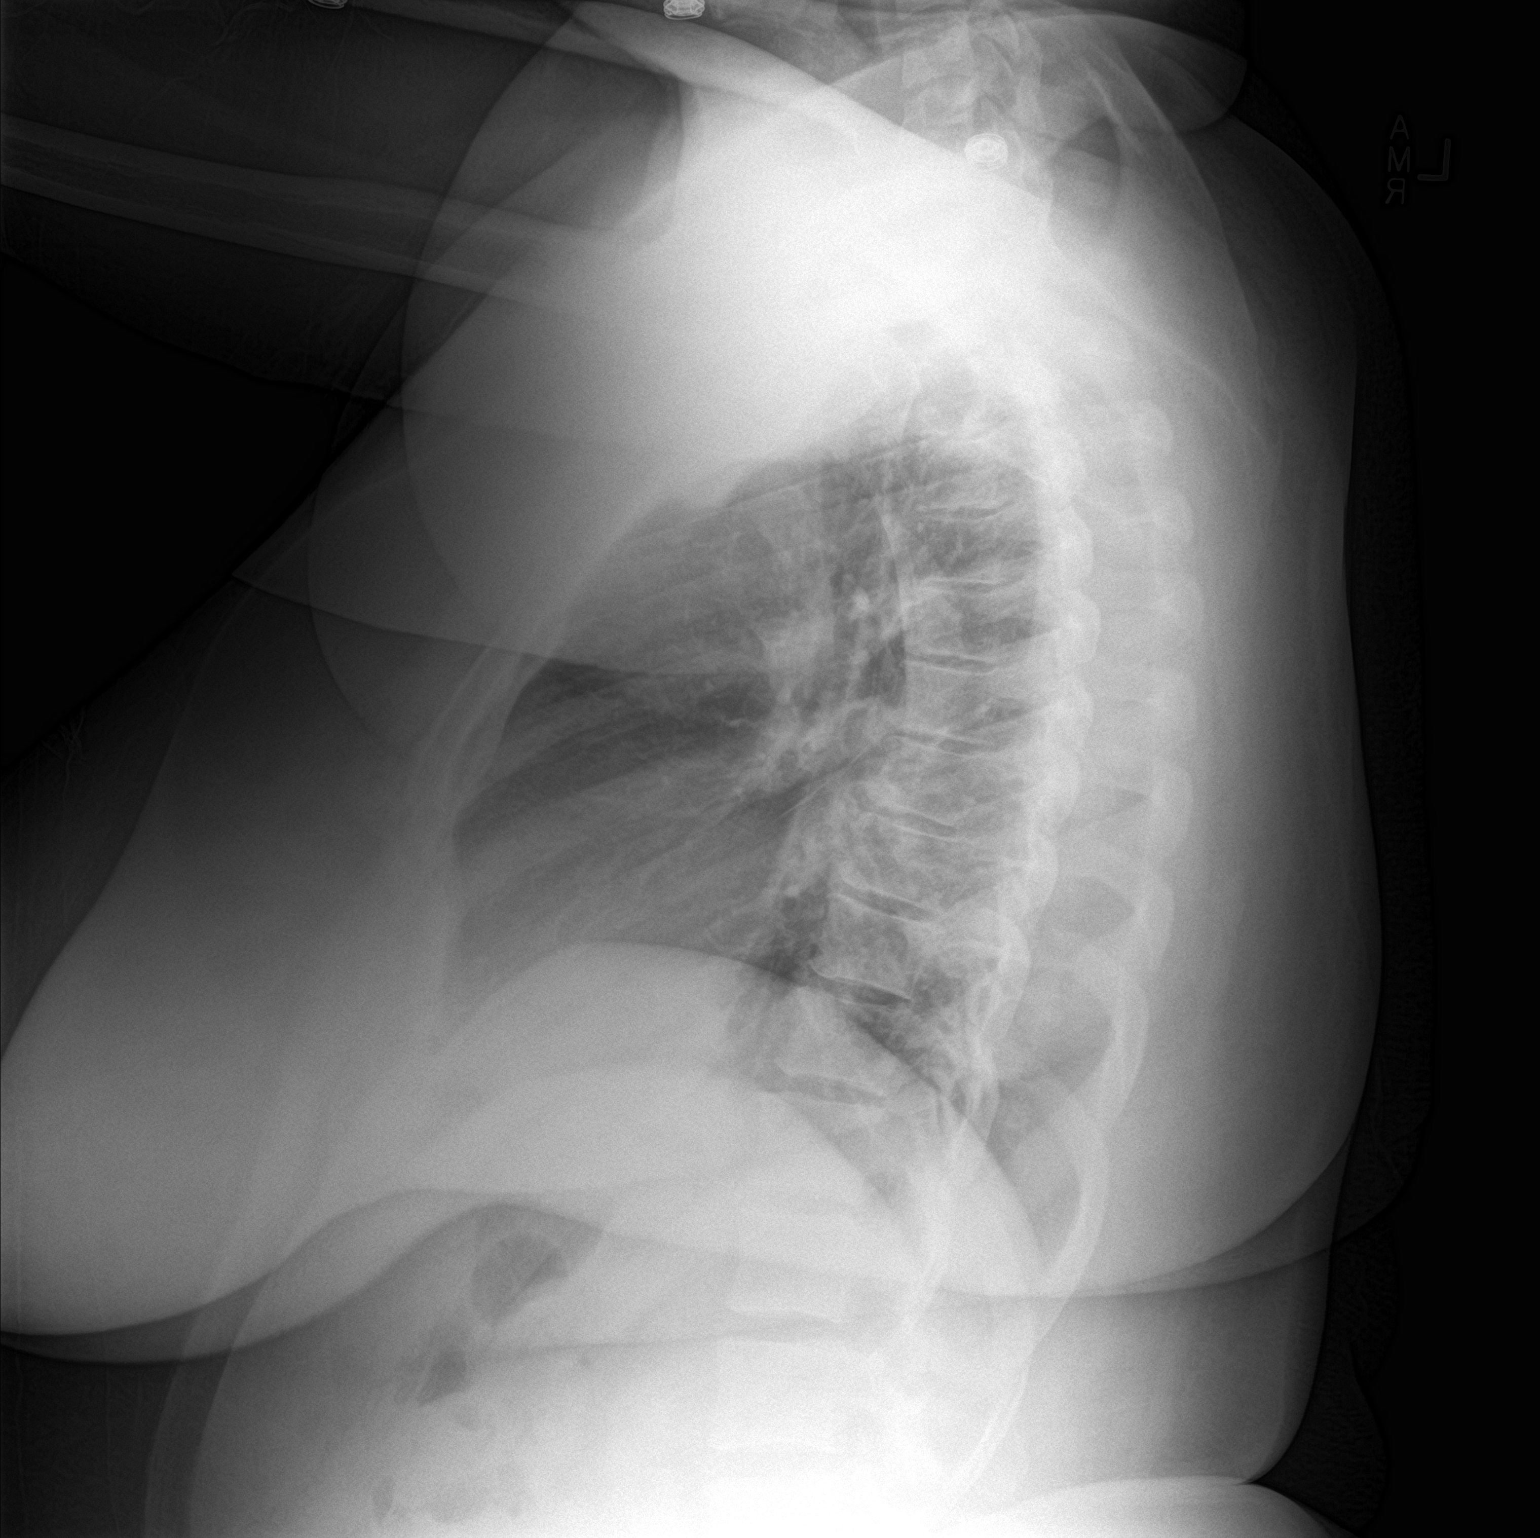

[2 of 2 positions shown; findings below may reference images not displayed]

FINDINGS: Lungs are clear. Heart size and pulmonary vascularity are normal. No
adenopathy appreciable. Surgical clips overlie the aortic arch. No
bone lesions.
IMPRESSION: No edema or consolidation.  No evident adenopathy.
